# Patient Record
Sex: Male | Born: 1940
Health system: Southern US, Community
[De-identification: ages and names within clinical notes are randomized; demographics above are authoritative.]

## PROBLEM LIST (undated history)

## (undated) DIAGNOSIS — G43909 Migraine, unspecified, not intractable, without status migrainosus: Secondary | ICD-10-CM

## (undated) DIAGNOSIS — K635 Polyp of colon: Secondary | ICD-10-CM

## (undated) DIAGNOSIS — I10 Essential (primary) hypertension: Secondary | ICD-10-CM

## (undated) DIAGNOSIS — H25019 Cortical age-related cataract, unspecified eye: Secondary | ICD-10-CM

## (undated) DIAGNOSIS — G4700 Insomnia, unspecified: Secondary | ICD-10-CM

## (undated) DIAGNOSIS — F419 Anxiety disorder, unspecified: Secondary | ICD-10-CM

## (undated) DIAGNOSIS — K219 Gastro-esophageal reflux disease without esophagitis: Secondary | ICD-10-CM

## (undated) DIAGNOSIS — E119 Type 2 diabetes mellitus without complications: Secondary | ICD-10-CM

## (undated) DIAGNOSIS — E785 Hyperlipidemia, unspecified: Secondary | ICD-10-CM

## (undated) DIAGNOSIS — N189 Chronic kidney disease, unspecified: Secondary | ICD-10-CM

## (undated) DIAGNOSIS — E291 Testicular hypofunction: Secondary | ICD-10-CM

## (undated) DIAGNOSIS — N529 Male erectile dysfunction, unspecified: Secondary | ICD-10-CM

## (undated) HISTORY — PX: TONSILLECTOMY: SUR1361

## (undated) HISTORY — PX: NEPHRECTOMY: SHX65

---

## 2016-02-23 DIAGNOSIS — I456 Pre-excitation syndrome: Secondary | ICD-10-CM | POA: Insufficient documentation

## 2016-02-23 DIAGNOSIS — G4733 Obstructive sleep apnea (adult) (pediatric): Secondary | ICD-10-CM | POA: Insufficient documentation

## 2016-07-11 DIAGNOSIS — M1611 Unilateral primary osteoarthritis, right hip: Secondary | ICD-10-CM | POA: Insufficient documentation

## 2020-08-22 DIAGNOSIS — U071 COVID-19: Secondary | ICD-10-CM | POA: Diagnosis not present

## 2020-08-22 DIAGNOSIS — I10 Essential (primary) hypertension: Secondary | ICD-10-CM | POA: Diagnosis not present

## 2020-08-22 DIAGNOSIS — R55 Syncope and collapse: Secondary | ICD-10-CM | POA: Diagnosis not present

## 2020-08-22 DIAGNOSIS — Z91040 Latex allergy status: Secondary | ICD-10-CM | POA: Diagnosis not present

## 2020-08-22 DIAGNOSIS — R110 Nausea: Secondary | ICD-10-CM | POA: Insufficient documentation

## 2020-08-22 DIAGNOSIS — E119 Type 2 diabetes mellitus without complications: Secondary | ICD-10-CM | POA: Diagnosis not present

## 2020-08-22 DIAGNOSIS — R059 Cough, unspecified: Secondary | ICD-10-CM | POA: Diagnosis present

## 2020-08-23 ENCOUNTER — Emergency Department
Admission: EM | Admit: 2020-08-23 | Discharge: 2020-08-23 | Disposition: A | Discharge status: Home / Self Care | Payer: Medicare HMO | Attending: Emergency Medicine | Admitting: Emergency Medicine

## 2020-08-23 ENCOUNTER — Emergency Department: Payer: Medicare HMO

## 2020-08-23 ENCOUNTER — Encounter: Payer: Self-pay | Admitting: Emergency Medicine

## 2020-08-23 ENCOUNTER — Other Ambulatory Visit: Payer: Self-pay

## 2020-08-23 DIAGNOSIS — R55 Syncope and collapse: Secondary | ICD-10-CM

## 2020-08-23 DIAGNOSIS — E860 Dehydration: Secondary | ICD-10-CM

## 2020-08-23 DIAGNOSIS — U071 COVID-19: Secondary | ICD-10-CM

## 2020-08-23 HISTORY — DX: Type 2 diabetes mellitus without complications: E11.9

## 2020-08-23 HISTORY — DX: Essential (primary) hypertension: I10

## 2020-08-23 HISTORY — DX: Hyperlipidemia, unspecified: E78.5

## 2020-08-23 LAB — COMPREHENSIVE METABOLIC PANEL
ALT: 25 U/L (ref 0–44)
AST: 29 U/L (ref 15–41)
Albumin: 3.5 g/dL (ref 3.5–5.0)
Alkaline Phosphatase: 72 U/L (ref 38–126)
Anion gap: 11 (ref 5–15)
BUN: 23 mg/dL (ref 8–23)
CO2: 24 mmol/L (ref 22–32)
Calcium: 8.2 mg/dL — ABNORMAL LOW (ref 8.9–10.3)
Chloride: 100 mmol/L (ref 98–111)
Creatinine, Ser: 1.31 mg/dL — ABNORMAL HIGH (ref 0.61–1.24)
GFR, Estimated: 56 mL/min — ABNORMAL LOW (ref >60)
Glucose, Bld: 112 mg/dL — ABNORMAL HIGH (ref 70–99)
Potassium: 4 mmol/L (ref 3.5–5.1)
Sodium: 135 mmol/L (ref 135–145)
Total Bilirubin: 1.1 mg/dL (ref 0.3–1.2)
Total Protein: 7 g/dL (ref 6.5–8.1)

## 2020-08-23 LAB — CBC
HCT: 49.9 % (ref 39.0–52.0)
Hemoglobin: 16.4 g/dL (ref 13.0–17.0)
MCH: 27.9 pg (ref 26.0–34.0)
MCHC: 32.9 g/dL (ref 30.0–36.0)
MCV: 85 fL (ref 80.0–100.0)
Platelets: 148 10*3/uL — ABNORMAL LOW (ref 150–400)
RBC: 5.87 MIL/uL — ABNORMAL HIGH (ref 4.22–5.81)
RDW: 14.6 % (ref 11.5–15.5)
WBC: 6.6 10*3/uL (ref 4.0–10.5)
nRBC: 0 % (ref 0.0–0.2)

## 2020-08-23 LAB — RESP PANEL BY RT-PCR (FLU A&B, COVID) ARPGX2
Influenza A by PCR: NEGATIVE
Influenza B by PCR: NEGATIVE
SARS Coronavirus 2 by RT PCR: POSITIVE — AB

## 2020-08-23 LAB — TROPONIN I (HIGH SENSITIVITY): Troponin I (High Sensitivity): 13 ng/L (ref <18)

## 2020-08-23 MED ORDER — SODIUM CHLORIDE 0.9 % IV BOLUS
1000.0000 mL | Freq: Once | INTRAVENOUS | Status: AC
  Administered 2020-08-23: 1000 mL via INTRAVENOUS

## 2020-08-23 NOTE — ED Triage Notes (Signed)
Pt in with co syncopal episode at home hx of diarrhea x 6 days. Has recently been exposed to covid but has not been tested. Normal HR is around 50, 46bpm presently in triage. Denies any injury from fall, does have a cough.

## 2020-08-23 NOTE — ED Notes (Signed)
Pt sts he is feeling better after IV bolus. Easily arousable from sleep.

## 2020-08-23 NOTE — ED Triage Notes (Signed)
EMS brings pt in from home for c/o cough; here with wife with covid-like symptoms; near-syncope PTA

## 2020-08-23 NOTE — ED Notes (Addendum)
Pt ambulated 100 ft with RN escorting. Strong steady gait without assistance. O2 on RA remained 94-96% while ambulating.  Denies dizziness.

## 2020-08-23 NOTE — ED Notes (Signed)
Pt c/o syncope x 3 today. Notes mild headache. Denies migraines. No focal weakness. Speech is clear. Pt denies chest pain or SOB.  Reports intermittent nausea x 1 week with some abdominal cramping. Reports 2 episodes of watery diarrhea today.

## 2020-08-23 NOTE — Discharge Instructions (Addendum)
Please seek medical attention for any high fevers, chest pain, shortness of breath, change in behavior, persistent vomiting, bloody stool or any other new or concerning symptoms.  

## 2020-08-23 NOTE — ED Provider Notes (Signed)
[facility] Emergency Department Provider Note   ____________________________________________   I have reviewed the triage vital signs and the nursing notes.   HISTORY  Chief Complaint Syncopal episode History limited by: Not Limited   HPI Gregory Campbell is a 80 y.o. male who presents to the emergency department today with primary concern for a syncopal episode.  Patient states he was actually try to talk to his wife who just passed out.  He says that he has felt sick for roughly the past 10 days.  He has had nausea, decreased appetite and cough.  His wife has had similar symptoms and he did have a COVID exposure.  Patient denies any significant shortness of breath or chest pain.  Denies any fevers.  He has been feeling weak over the past 10 days but today was first in the past that.   Records reviewed. Per medical record review patient has a history of DM, HLD, HTN.   Past Medical History:  Diagnosis Date  . Diabetes mellitus without complication ([place])   . Hyperlipidemia   . Hypertension     There are no problems to display for this patient.     Prior to Admission medications   Not on File    Allergies Codeine and Latex  No family history on file.  Social History    Review of Systems Constitutional: No fever/chills Eyes: No visual changes. ENT: No sore throat. Cardiovascular: Denies chest pain. Respiratory: Denies shortness of breath. Positive for cough. Gastrointestinal: No abdominal pain.  Positive for nausea, diarrhea and decreased appetite.  Genitourinary: Negative for dysuria. Musculoskeletal: Negative for back pain. Skin: Negative for rash. Neurological: Positive for syncopal episode. ____________________________________________   PHYSICAL EXAM:  VITAL SIGNS: ED Triage Vitals  Enc Vitals Group     BP 08/23/20 0011 (!) 106/49     Pulse Rate 08/23/20 0011 (!) 45     Resp 08/23/20 0011 20     Temp 08/23/20 0011 98.3 F  (36.8 C)     Temp Source 08/23/20 0011 Oral     SpO2 08/23/20 0005 93 %     Weight 08/23/20 0012 226 lb (102.5 kg)     Height 08/23/20 0012 5\' 11"  (1.803 m)     Head Circumference --      Peak Flow --      Pain Score 08/23/20 0012 5   Constitutional: Alert and oriented.  Eyes: Conjunctivae are normal.  ENT      Head: Normocephalic and atraumatic.      Nose: No congestion/rhinnorhea.      Mouth/Throat: Mucous membranes are moist.      Neck: No stridor. Hematological/Lymphatic/Immunilogical: No cervical lymphadenopathy. Cardiovascular: Normal rate, regular rhythm.  No murmurs, rubs, or gallops.  Respiratory: Normal respiratory effort without tachypnea nor retractions. Breath sounds are clear and equal bilaterally. No wheezes/rales/rhonchi. Gastrointestinal: Soft and non tender. No rebound. No guarding.  Genitourinary: Deferred Musculoskeletal: Normal range of motion in all extremities. No lower extremity edema. Neurologic:  Normal speech and language. No gross focal neurologic deficits are appreciated.  Skin:  Skin is warm, dry and intact. No rash noted. Psychiatric: Mood and affect are normal. Speech and behavior are normal. Patient exhibits appropriate insight and judgment.  ____________________________________________    LABS (pertinent positives/negatives)  CBC wbc 6.6, hgb 16.4, plt 148 COVID positive Trop hs 13 CMP na 135, k 4.0, cl 100, glu 112, cr 1.31 ____________________________________________   EKG  I, Nance Pear, attending physician, personally viewed and  interpreted this EKG  EKG Time: 0016 Rate: 45 Rhythm: sinus bradycardia Axis: left axis deviation Intervals: qtc 420 QRS: narrow ST changes: no st elevation Impression: abnormal ekg  ____________________________________________    RADIOLOGY  CXR Hazy bilateral  opacities  ____________________________________________   PROCEDURES  Procedures  ____________________________________________   INITIAL IMPRESSION / ASSESSMENT AND PLAN / ED COURSE  Pertinent labs & imaging results that were available during my care of the patient were reviewed by me and considered in my medical decision making (see chart for details).   Patient presented to the emergency department today after a syncopal episode.  Patient did have symptoms concerning for possible COVID infection and his COVID was positive.  Patient was not hypoxic here.  Did have concerns for some dehydration and after liter of fluid patient was no longer orthostatic.  This time do think it is reasonable for patient to be discharged home.  Will send patient's information to ambulatory referral for COVID treatment.    ____________________________________________   FINAL CLINICAL IMPRESSION(S) / ED DIAGNOSES  Final diagnoses:  COVID-19  Dehydration     Note: This dictation was prepared with Dragon dictation. Any transcriptional errors that result from this process are unintentional     Nance Pear, MD 08/23/20 [phone]

## 2020-08-24 ENCOUNTER — Telehealth: Payer: Self-pay | Admitting: *Deleted

## 2020-08-24 NOTE — Telephone Encounter (Signed)
Called to discuss with patient about COVID-19 symptoms and the use of one of the available treatments for those with mild to moderate Covid symptoms and at a high risk of hospitalization.  Pt appears to qualify for outpatient treatment due to co-morbid conditions and/or a member of an at-risk group in accordance with the FDA Emergency Use Authorization.    Symptom onset:  Vaccinated:  Booster?  Immunocompromised?  Qualifiers:   Unable to reach pt - Left VM to return call for information  Tesean Stump, Gillis Ends

## 2020-12-18 ENCOUNTER — Other Ambulatory Visit (HOSPITAL_COMMUNITY): Payer: Self-pay | Admitting: Internal Medicine

## 2020-12-18 ENCOUNTER — Other Ambulatory Visit: Payer: Self-pay | Admitting: Internal Medicine

## 2020-12-18 DIAGNOSIS — R109 Unspecified abdominal pain: Secondary | ICD-10-CM

## 2020-12-20 ENCOUNTER — Ambulatory Visit: Payer: Medicare HMO

## 2021-01-02 ENCOUNTER — Ambulatory Visit
Admission: RE | Admit: 2021-01-02 | Discharge: 2021-01-02 | Disposition: A | Discharge status: Home / Self Care | Payer: Medicare HMO | Source: Ambulatory Visit | Attending: Internal Medicine | Admitting: Internal Medicine

## 2021-01-02 ENCOUNTER — Other Ambulatory Visit: Payer: Self-pay

## 2021-01-02 DIAGNOSIS — R109 Unspecified abdominal pain: Secondary | ICD-10-CM | POA: Insufficient documentation

## 2021-01-02 IMAGING — CT CT ABD-PELV W/O CM
2 of 4 series · 16 of 46 positions shown, 18 images · non-contrast
Comparison: None available

CLINICAL DATA: Umbilical pain and bulge, right. History of
nephrectomy for renal cell carcinoma [T4].

EXAM:
CT ABDOMEN AND PELVIS WITHOUT CONTRAST
TECHNIQUE: Multidetector CT imaging of the abdomen and pelvis was performed
following the standard protocol without IV contrast.

[Series 2: axial st · axial · 0.87mm/px · z∈[-361,+149]mm · 13 of 112 slices shown, 15 images]
[im 5/112  soft-tissue]
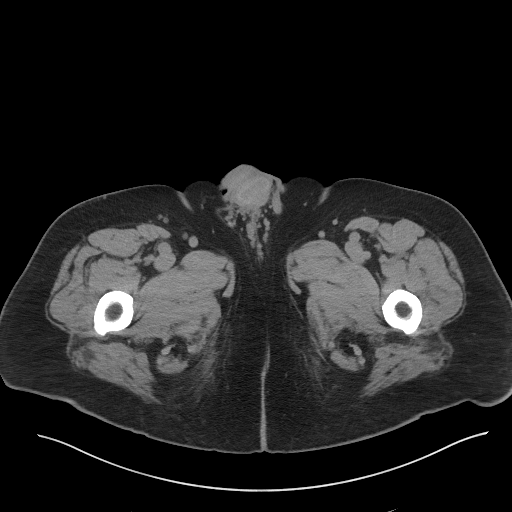
[im 5/112  bone]
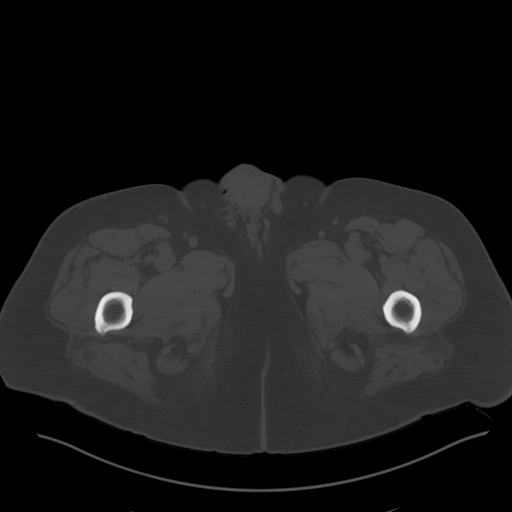
[im 15/112  soft-tissue]
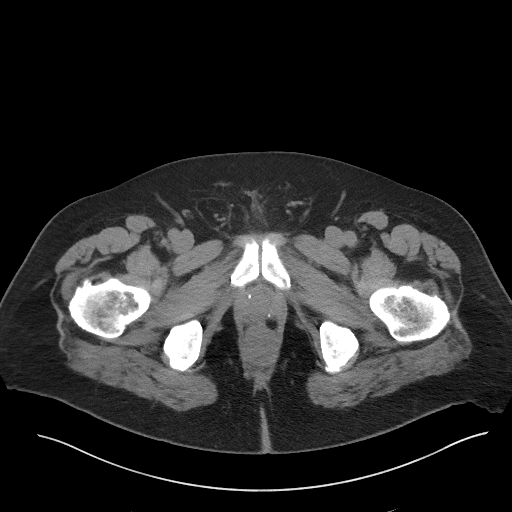
[im 25/112  soft-tissue]
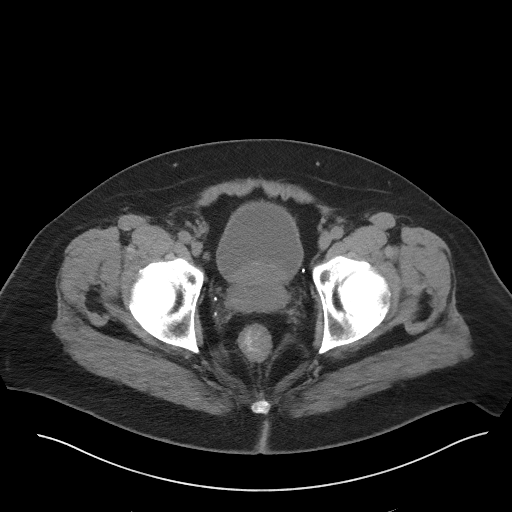
[im 29/112  soft-tissue]
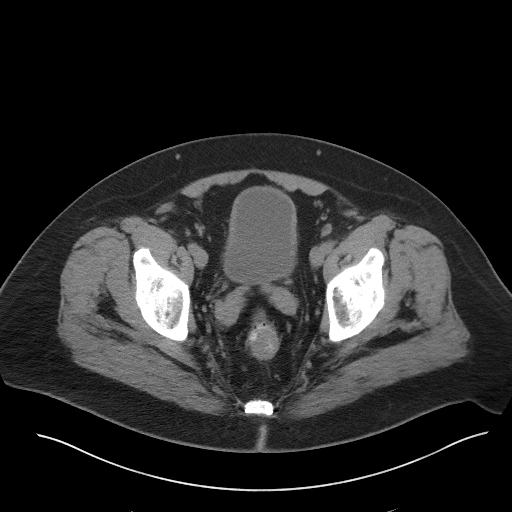
[im 39/112  soft-tissue]
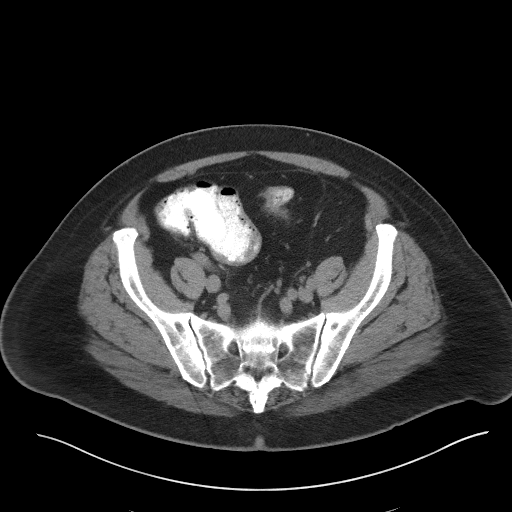
[im 49/112  soft-tissue]
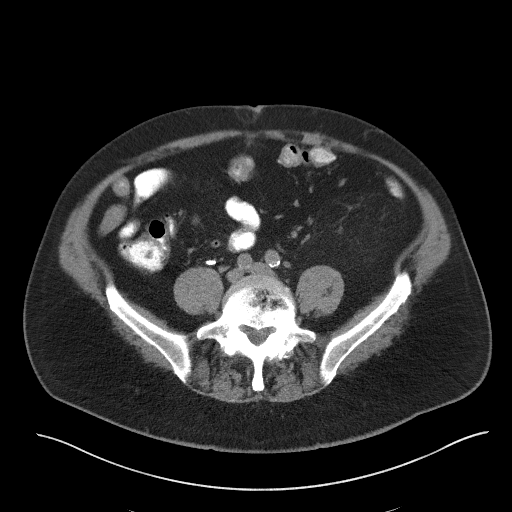
[im 58/112  soft-tissue]
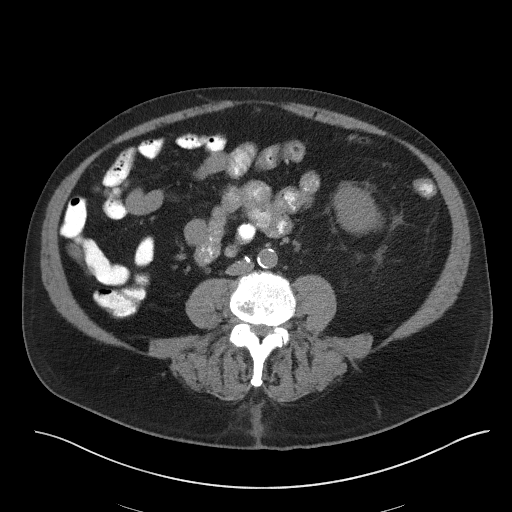
[im 63/112  soft-tissue]
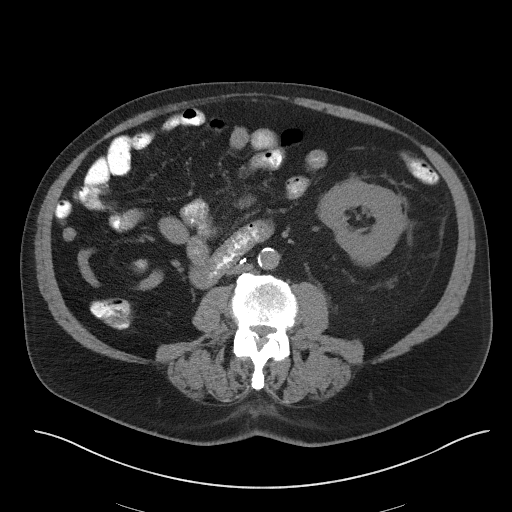
[im 73/112  soft-tissue]
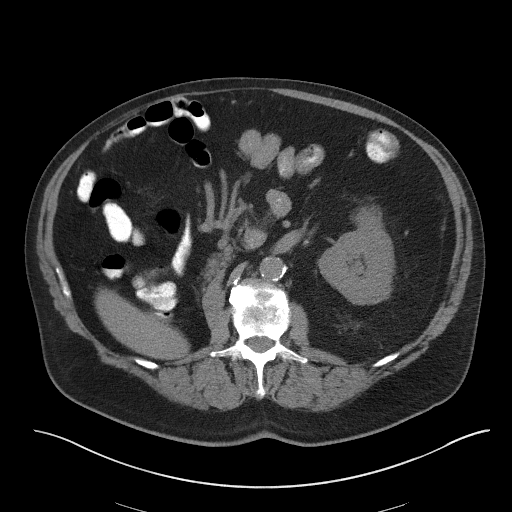
[im 73/112  bone]
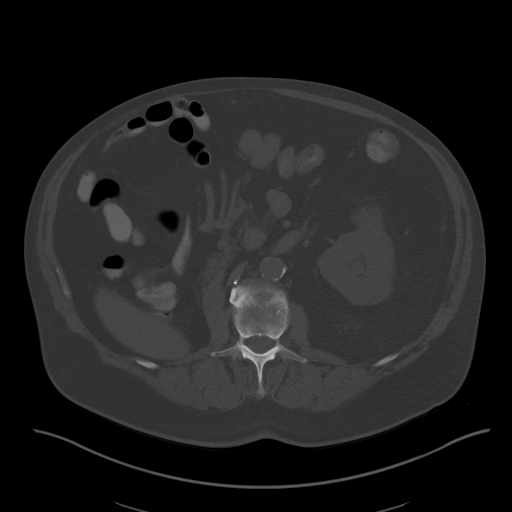
[im 83/112  soft-tissue]
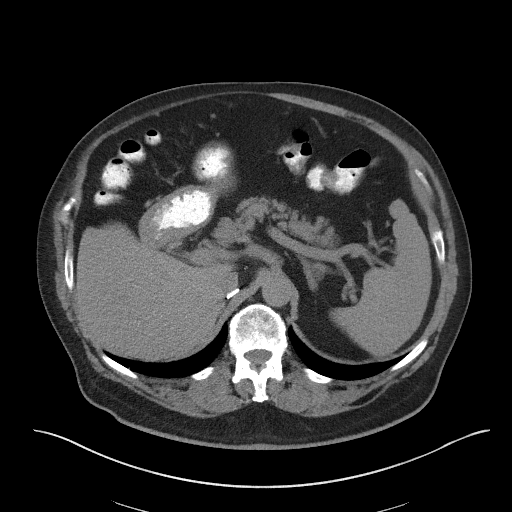
[im 87/112  soft-tissue]
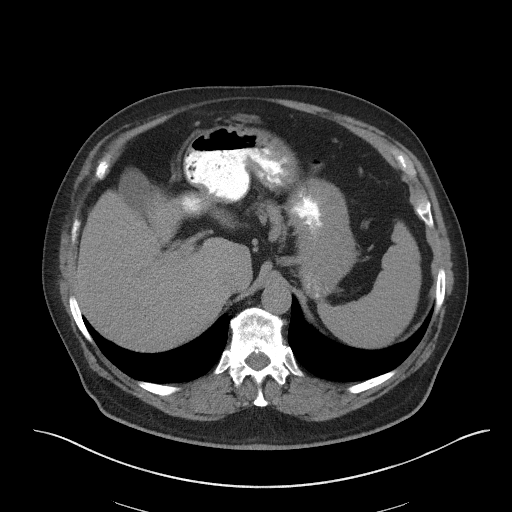
[im 97/112  soft-tissue]
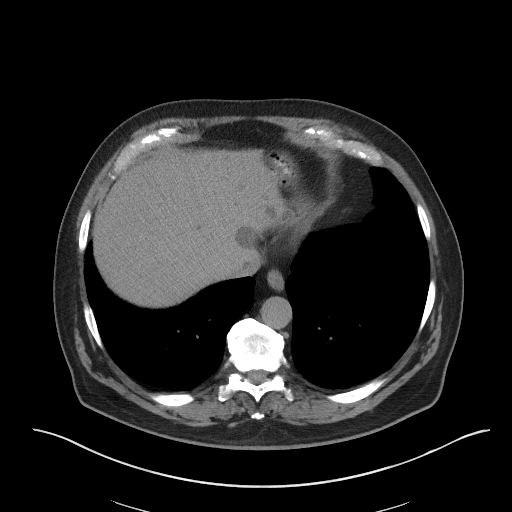
[im 107/112  soft-tissue]
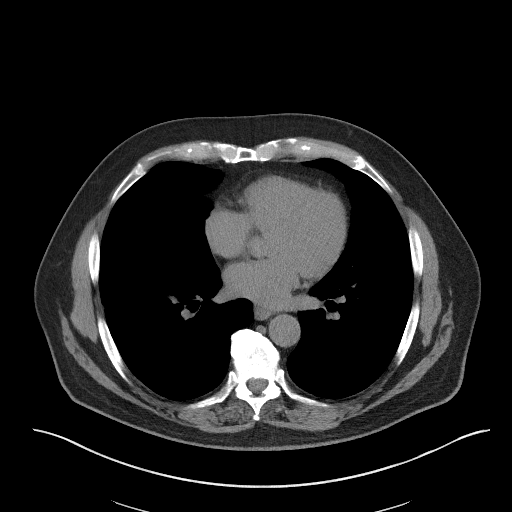

[Series 5: coronal st · coronal · 0.94mm/px · 3 of 110 slices shown]
[im 37/110  soft-tissue]
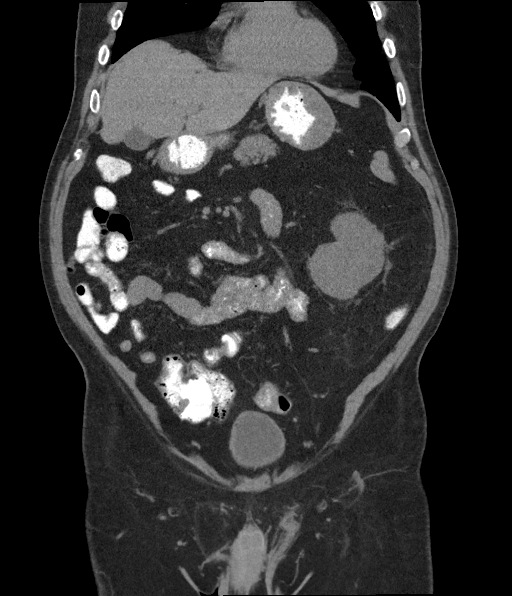
[im 49/110  soft-tissue]
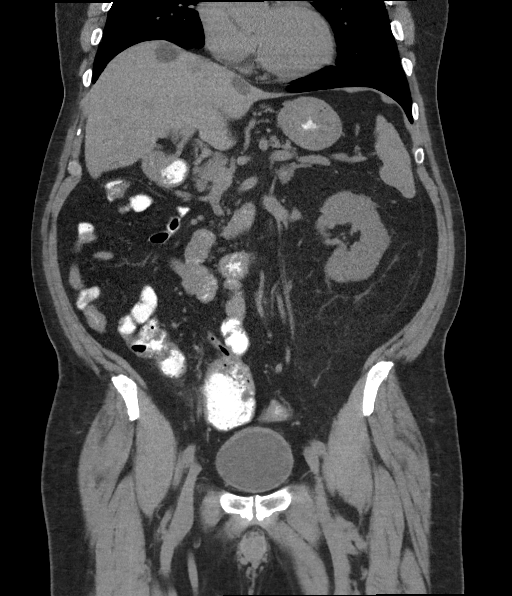
[im 61/110  soft-tissue]
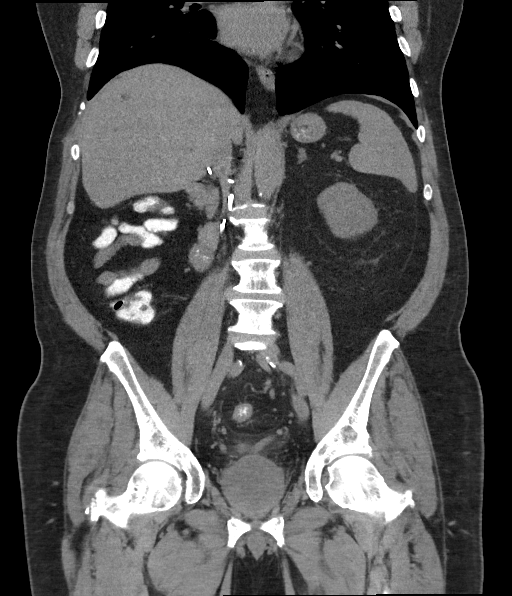

[16 of 46 positions shown; findings below may reference images not displayed]

FINDINGS: Lower chest: No pleural or pericardial effusion.

Hepatobiliary: Scattered low-attenuation liver lesions largest
cm in segment 7 measures fluid attenuation presumably cyst.
Immediately medial is a 3 cm lesion which measures above simple
fluid, nonspecific. Others are smaller and incompletely
characterized. Gallbladder is nondistended. No biliary ductal
dilatation.

Pancreas: Unremarkable. No pancreatic ductal dilatation or
surrounding inflammatory changes.

Spleen: Normal in size without focal abnormality.

Adrenals/Urinary Tract: Previous right nephrectomy. Left adrenal
gland and kidney unremarkable. Urinary bladder incompletely
distended.

Stomach/Bowel: Stomach and small bowel are nondistended. Normal
appendix. The colon is nondilated, unremarkable.

Vascular/Lymphatic: Scattered aortoiliac calcified plaque without
aneurysm. No abdominal or pelvic adenopathy.

Reproductive: Prostate enlargement.

Other: Left pelvic phlebolith.  No ascites.  No free air.

Musculoskeletal: Small right paraumbilical hernia containing only
mesenteric fat. Degenerative disc disease L4-5, L5-S1. No fracture
or worrisome bone lesion.
IMPRESSION: 1. Small right paraumbilical hernia containing only  mesenteric fat.
2. Multiple liver lesions, the largest probable cyst but the
remainder nonspecific. In the absence of prior studies to
demonstrate stability, liver protocol MR with contrast suggested for
further characterization.

## 2021-01-15 ENCOUNTER — Other Ambulatory Visit (HOSPITAL_COMMUNITY): Payer: Self-pay | Admitting: Internal Medicine

## 2021-01-15 ENCOUNTER — Other Ambulatory Visit: Payer: Self-pay | Admitting: Internal Medicine

## 2021-01-15 DIAGNOSIS — R935 Abnormal findings on diagnostic imaging of other abdominal regions, including retroperitoneum: Secondary | ICD-10-CM

## 2021-01-15 DIAGNOSIS — R109 Unspecified abdominal pain: Secondary | ICD-10-CM

## 2021-01-25 ENCOUNTER — Ambulatory Visit
Admission: RE | Admit: 2021-01-25 | Discharge: 2021-01-25 | Disposition: A | Discharge status: Home / Self Care | Payer: Medicare HMO | Source: Ambulatory Visit | Attending: Internal Medicine | Admitting: Internal Medicine

## 2021-01-25 ENCOUNTER — Other Ambulatory Visit: Payer: Self-pay

## 2021-01-25 DIAGNOSIS — R109 Unspecified abdominal pain: Secondary | ICD-10-CM | POA: Insufficient documentation

## 2021-01-25 DIAGNOSIS — R935 Abnormal findings on diagnostic imaging of other abdominal regions, including retroperitoneum: Secondary | ICD-10-CM | POA: Diagnosis present

## 2021-01-25 MED ORDER — GADOBUTROL 1 MMOL/ML IV SOLN
10.0000 mL | Freq: Once | INTRAVENOUS | Status: AC | PRN
  Administered 2021-01-25: 10 mL via INTRAVENOUS

## 2021-01-31 ENCOUNTER — Other Ambulatory Visit: Payer: Self-pay | Admitting: Internal Medicine

## 2021-01-31 DIAGNOSIS — K7689 Other specified diseases of liver: Secondary | ICD-10-CM

## 2021-02-02 DIAGNOSIS — Z9841 Cataract extraction status, right eye: Secondary | ICD-10-CM | POA: Diagnosis not present

## 2021-02-02 DIAGNOSIS — Z9842 Cataract extraction status, left eye: Secondary | ICD-10-CM | POA: Diagnosis not present

## 2021-02-02 DIAGNOSIS — H40023 Open angle with borderline findings, high risk, bilateral: Secondary | ICD-10-CM | POA: Diagnosis not present

## 2021-02-02 DIAGNOSIS — H52223 Regular astigmatism, bilateral: Secondary | ICD-10-CM | POA: Diagnosis not present

## 2021-02-02 DIAGNOSIS — E119 Type 2 diabetes mellitus without complications: Secondary | ICD-10-CM | POA: Diagnosis not present

## 2021-02-26 DIAGNOSIS — E1122 Type 2 diabetes mellitus with diabetic chronic kidney disease: Secondary | ICD-10-CM | POA: Diagnosis not present

## 2021-02-28 DIAGNOSIS — G8929 Other chronic pain: Secondary | ICD-10-CM | POA: Diagnosis not present

## 2021-02-28 DIAGNOSIS — N1831 Chronic kidney disease, stage 3a: Secondary | ICD-10-CM | POA: Diagnosis not present

## 2021-02-28 DIAGNOSIS — M5136 Other intervertebral disc degeneration, lumbar region: Secondary | ICD-10-CM | POA: Diagnosis not present

## 2021-02-28 DIAGNOSIS — Z794 Long term (current) use of insulin: Secondary | ICD-10-CM | POA: Diagnosis not present

## 2021-02-28 DIAGNOSIS — M545 Low back pain, unspecified: Secondary | ICD-10-CM | POA: Diagnosis not present

## 2021-02-28 DIAGNOSIS — E1122 Type 2 diabetes mellitus with diabetic chronic kidney disease: Secondary | ICD-10-CM | POA: Diagnosis not present

## 2021-02-28 DIAGNOSIS — M5441 Lumbago with sciatica, right side: Secondary | ICD-10-CM | POA: Diagnosis not present

## 2021-02-28 DIAGNOSIS — M5431 Sciatica, right side: Secondary | ICD-10-CM | POA: Diagnosis not present

## 2021-02-28 DIAGNOSIS — E669 Obesity, unspecified: Secondary | ICD-10-CM | POA: Diagnosis not present

## 2021-03-14 ENCOUNTER — Other Ambulatory Visit: Payer: Self-pay | Admitting: Orthopedic Surgery

## 2021-03-14 DIAGNOSIS — G8929 Other chronic pain: Secondary | ICD-10-CM

## 2021-03-14 DIAGNOSIS — M5136 Other intervertebral disc degeneration, lumbar region: Secondary | ICD-10-CM

## 2021-03-14 DIAGNOSIS — M5441 Lumbago with sciatica, right side: Secondary | ICD-10-CM | POA: Diagnosis not present

## 2021-03-14 DIAGNOSIS — M4807 Spinal stenosis, lumbosacral region: Secondary | ICD-10-CM | POA: Diagnosis not present

## 2021-03-14 DIAGNOSIS — M5431 Sciatica, right side: Secondary | ICD-10-CM | POA: Diagnosis not present

## 2021-03-21 DIAGNOSIS — E785 Hyperlipidemia, unspecified: Secondary | ICD-10-CM | POA: Diagnosis not present

## 2021-03-21 DIAGNOSIS — E1142 Type 2 diabetes mellitus with diabetic polyneuropathy: Secondary | ICD-10-CM | POA: Diagnosis not present

## 2021-03-21 DIAGNOSIS — Z794 Long term (current) use of insulin: Secondary | ICD-10-CM | POA: Diagnosis not present

## 2021-03-21 DIAGNOSIS — E1169 Type 2 diabetes mellitus with other specified complication: Secondary | ICD-10-CM | POA: Diagnosis not present

## 2021-03-24 ENCOUNTER — Other Ambulatory Visit: Payer: Self-pay

## 2021-03-24 ENCOUNTER — Encounter: Payer: Self-pay | Admitting: Emergency Medicine

## 2021-03-24 DIAGNOSIS — K5903 Drug induced constipation: Secondary | ICD-10-CM | POA: Insufficient documentation

## 2021-03-24 DIAGNOSIS — E119 Type 2 diabetes mellitus without complications: Secondary | ICD-10-CM | POA: Diagnosis not present

## 2021-03-24 DIAGNOSIS — Z91040 Latex allergy status: Secondary | ICD-10-CM | POA: Insufficient documentation

## 2021-03-24 DIAGNOSIS — R339 Retention of urine, unspecified: Secondary | ICD-10-CM | POA: Diagnosis not present

## 2021-03-24 DIAGNOSIS — R109 Unspecified abdominal pain: Secondary | ICD-10-CM | POA: Diagnosis not present

## 2021-03-24 DIAGNOSIS — K5900 Constipation, unspecified: Secondary | ICD-10-CM | POA: Diagnosis not present

## 2021-03-24 DIAGNOSIS — I10 Essential (primary) hypertension: Secondary | ICD-10-CM | POA: Diagnosis not present

## 2021-03-24 LAB — URINALYSIS, COMPLETE (UACMP) WITH MICROSCOPIC
Bilirubin Urine: NEGATIVE
Glucose, UA: 100 mg/dL — AB
Hgb urine dipstick: NEGATIVE
Ketones, ur: NEGATIVE mg/dL
Leukocytes,Ua: NEGATIVE
Nitrite: NEGATIVE
Protein, ur: 30 mg/dL — AB
Specific Gravity, Urine: 1.015 (ref 1.005–1.030)
Squamous Epithelial / HPF: NONE SEEN (ref 0–5)
pH: 5 (ref 5.0–8.0)

## 2021-03-24 LAB — CBC
HCT: 49.7 % (ref 39.0–52.0)
Hemoglobin: 16.5 g/dL (ref 13.0–17.0)
MCH: 28.7 pg (ref 26.0–34.0)
MCHC: 33.2 g/dL (ref 30.0–36.0)
MCV: 86.6 fL (ref 80.0–100.0)
Platelets: 166 10*3/uL (ref 150–400)
RBC: 5.74 MIL/uL (ref 4.22–5.81)
RDW: 14.3 % (ref 11.5–15.5)
WBC: 8 10*3/uL (ref 4.0–10.5)
nRBC: 0 % (ref 0.0–0.2)

## 2021-03-24 LAB — BASIC METABOLIC PANEL
Anion gap: 9 (ref 5–15)
BUN: 32 mg/dL — ABNORMAL HIGH (ref 8–23)
CO2: 23 mmol/L (ref 22–32)
Calcium: 9.3 mg/dL (ref 8.9–10.3)
Chloride: 102 mmol/L (ref 98–111)
Creatinine, Ser: 1.34 mg/dL — ABNORMAL HIGH (ref 0.61–1.24)
GFR, Estimated: 54 mL/min — ABNORMAL LOW (ref >60)
Glucose, Bld: 296 mg/dL — ABNORMAL HIGH (ref 70–99)
Potassium: 5.2 mmol/L — ABNORMAL HIGH (ref 3.5–5.1)
Sodium: 134 mmol/L — ABNORMAL LOW (ref 135–145)

## 2021-03-24 NOTE — ED Triage Notes (Signed)
Pt up to desk repeatedly yelling out, "Please help, please have mercy, please help me". This RN placed patient back in a wheelchair, explained will pull patient for triage as soon as possible.

## 2021-03-24 NOTE — ED Notes (Signed)
Pt called this RN over to request to be moved closer, pt states he does not want to watch the fights that are on the TV.

## 2021-03-24 NOTE — ED Triage Notes (Signed)
First RN Note: Pt to ED via POV with c/o constipation x 2 days, states has not urinated x 2 days.

## 2021-03-25 ENCOUNTER — Emergency Department: Payer: Medicare HMO

## 2021-03-25 ENCOUNTER — Emergency Department
Admission: EM | Admit: 2021-03-25 | Discharge: 2021-03-25 | Disposition: A | Discharge status: Home / Self Care | Payer: Medicare HMO | Attending: Emergency Medicine | Admitting: Emergency Medicine

## 2021-03-25 DIAGNOSIS — R109 Unspecified abdominal pain: Secondary | ICD-10-CM | POA: Diagnosis not present

## 2021-03-25 DIAGNOSIS — K5903 Drug induced constipation: Secondary | ICD-10-CM

## 2021-03-25 DIAGNOSIS — R339 Retention of urine, unspecified: Secondary | ICD-10-CM

## 2021-03-25 LAB — CBG MONITORING, ED: Glucose-Capillary: 160 mg/dL — ABNORMAL HIGH (ref 70–99)

## 2021-03-25 MED ORDER — BISACODYL 10 MG RE SUPP: 10.0000 mg | Freq: Every day | RECTAL | 0 refills | Status: DC | PRN

## 2021-03-25 MED ORDER — ONDANSETRON 4 MG PO TBDP
4.0000 mg | ORAL_TABLET | Freq: Once | ORAL | Status: AC
  Administered 2021-03-25: 4 mg via ORAL
  Filled 2021-03-25: qty 1

## 2021-03-25 MED ORDER — BISACODYL 10 MG RE SUPP
10.0000 mg | Freq: Once | RECTAL | Status: AC
  Administered 2021-03-25: 10 mg via RECTAL
  Filled 2021-03-25: qty 1

## 2021-03-25 MED ORDER — BISACODYL 10 MG RE SUPP: 10.0000 mg | Freq: Every day | RECTAL | 0 refills | Status: AC | PRN

## 2021-03-25 MED ORDER — SORBITOL 70 % SOLN
960.0000 mL | TOPICAL_OIL | Freq: Once | ORAL | Status: AC
  Administered 2021-03-25: 960 mL via RECTAL
  Filled 2021-03-25: qty 473

## 2021-03-25 MED ORDER — LACTULOSE 10 GM/15ML PO SOLN: 20.0000 g | Freq: Three times a day (TID) | ORAL | 0 refills | Status: DC | PRN

## 2021-03-25 MED ORDER — LACTULOSE 10 GM/15ML PO SOLN
20.0000 g | Freq: Once | ORAL | Status: AC
  Administered 2021-03-25: 20 g via ORAL
  Filled 2021-03-25: qty 30

## 2021-03-25 MED ORDER — SODIUM CHLORIDE 0.9 % IV SOLN: INTRAVENOUS | Status: DC

## 2021-03-25 MED ORDER — IOHEXOL 350 MG/ML SOLN
75.0000 mL | Freq: Once | INTRAVENOUS | Status: AC | PRN
  Administered 2021-03-25: 75 mL via INTRAVENOUS

## 2021-03-25 NOTE — ED Provider Notes (Signed)
Patient care assumed at 7 AM.  Briefly, 80 year old male here with acute urinary retention and constipation.  Suspect drug-induced constipation with secondary urinary retention.  CT scan pending.  Labs Reviewed  URINALYSIS, COMPLETE (UACMP) WITH MICROSCOPIC - Abnormal; Notable for the following components:      Result Value   Glucose, UA 100 (*)    Protein, ur 30 (*)    Bacteria, UA RARE (*)    All other components within normal limits  BASIC METABOLIC PANEL - Abnormal; Notable for the following components:   Sodium 134 (*)    Potassium 5.2 (*)    Glucose, Bld 296 (*)    BUN 32 (*)    Creatinine, Ser 1.34 (*)    GFR, Estimated 54 (*)    All other components within normal limits  CBC    Course of Care: CT scan reviewed, shows significant stool burden but otherwise no acute intra-abdominal pathology.  He has had an enema with significant bowel movement here.  Will start on a high dose regimen in addition to suppositories, discharged with outpatient follow-up.  No apparent emergent medical pathologies.     Duffy Bruce, MD 03/25/21 1026

## 2021-03-25 NOTE — ED Notes (Signed)
Leg bag placed on pt, with teaching to both pt and wife. All questions answered.

## 2021-03-25 NOTE — Discharge Instructions (Addendum)
I recommend that you increase your water and fiber intake. If you are not able to eat foods high in fiber, you may use Benefiber or Metamucil over-the-counter. I also recommend  that after your current issue resolves, you use MiraLAX 1-2 times a day and Colace 100 mg twice a day to help with bowel movements. These medications are over the counter.  You may use other over-the-counter medications such as Dulcolax, Fleet enemas, magnesium citrate as needed for constipation. Please note that some of these medications may cause you to have abdominal cramping which is normal. If you develop severe abdominal pain, fever (temperature of 100.4 or higher), persistent vomiting, distention of your abdomen, unable to have a bowel movement for 5 days or are not passing gas, please return to the hospital.  CALL UROLOGY (DR. Erlene Quan) for Foley catheter follow-up and removal in 3-4 days

## 2021-03-25 NOTE — ED Provider Notes (Signed)
[facility] Emergency Department Provider Note  ____________________________________________   Event Date/Time   First MD Initiated Contact with Patient 03/25/21 [phone]     (approximate)  I have reviewed the triage vital signs and the nursing notes.   HISTORY  Chief Complaint Urinary Retention    HPI Gregory Campbell is a 80 y.o. male with history of hypertension, diabetes, hyperlipidemia who presents to the emergency department complaints of constipation and urinary retention.  States he has not had a normal bowel movement in 2 days because he is taking hydrocodone for chronic hip pain.  He was trying to give himself an enema at home without success.  States he is passing gas.  No nausea, vomiting.  No fever.  Also complaining of urinary retention without dysuria, hematuria or discharge.  He has never had urinary retention before.  Foley catheter placed in triage and he reports he is feeling better but asking for something for his constipation.        Past Medical History:  Diagnosis Date   Diabetes mellitus without complication ([place])    Hyperlipidemia    Hypertension     There are no problems to display for this patient.   History reviewed. No pertinent surgical history.  Prior to Admission medications   Not on File    Allergies Codeine and Latex  History reviewed. No pertinent family history.  Social History    Review of Systems Constitutional: No fever. Eyes: No visual changes. ENT: No sore throat. Cardiovascular: Denies chest pain. Respiratory: Denies shortness of breath. Gastrointestinal: No nausea, vomiting, diarrhea. Genitourinary: Negative for dysuria. Musculoskeletal: Negative for back pain. Skin: Negative for rash. Neurological: Negative for focal weakness or numbness.  ____________________________________________   PHYSICAL EXAM:  VITAL SIGNS: ED Triage Vitals  Enc Vitals Group     BP 03/24/21 2110 (!) 166/85      Pulse Rate 03/24/21 2110 81     Resp 03/24/21 2110 (!) 24     Temp 03/24/21 2110 98.4 F (36.9 C)     Temp Source 03/24/21 2110 Oral     SpO2 03/24/21 2110 96 %     Weight 03/24/21 2108 225 lb 15.5 oz (102.5 kg)     Height 03/24/21 2108 5' 10.75" (1.797 m)     Head Circumference --      Peak Flow --      Pain Score 03/24/21 2108 8     Pain Loc --      Pain Edu? --      Excl. in [place]? --    CONSTITUTIONAL: Alert and oriented and responds appropriately to questions. Well-appearing; well-nourished HEAD: Normocephalic EYES: Conjunctivae clear, pupils appear equal, EOM appear intact ENT: normal nose; moist mucous membranes NECK: Supple, normal ROM CARD: RRR; S1 and S2 appreciated; no murmurs, no clicks, no rubs, no gallops RESP: Normal chest excursion without splinting or tachypnea; breath sounds clear and equal bilaterally; no wheezes, no rhonchi, no rales, no hypoxia or respiratory distress, speaking full sentences ABD/GI: Normal bowel sounds; non-distended; soft, non-tender, no rebound, no guarding, no peritoneal signs, no hepatosplenomegaly BACK: The back appears normal EXT: Normal ROM in all joints; no deformity noted, no edema; no cyanosis SKIN: Normal color for age and race; warm; no rash on exposed skin NEURO: Moves all extremities equally PSYCH: The patient's mood and manner are appropriate.  ____________________________________________   LABS (all labs ordered are listed, but only abnormal results are displayed)  Labs Reviewed  URINALYSIS, COMPLETE (UACMP) WITH  MICROSCOPIC - Abnormal; Notable for the following components:      Result Value   Glucose, UA 100 (*)    Protein, ur 30 (*)    Bacteria, UA RARE (*)    All other components within normal limits  BASIC METABOLIC PANEL - Abnormal; Notable for the following components:   Sodium 134 (*)    Potassium 5.2 (*)    Glucose, Bld 296 (*)    BUN 32 (*)    Creatinine, Ser 1.34 (*)    GFR, Estimated 54 (*)    All other  components within normal limits  CBC   ____________________________________________  EKG   ____________________________________________  RADIOLOGY I, Avarae Zwart, personally viewed and evaluated these images (plain radiographs) as part of my medical decision making, as well as reviewing the written report by the radiologist.  ED MD interpretation:    Official radiology report(s): No results found.  ____________________________________________   PROCEDURES  Procedure(s) performed (including Critical Care):  Procedures   ____________________________________________   INITIAL IMPRESSION / ASSESSMENT AND PLAN / ED COURSE  As part of my medical decision making, I reviewed the following data within the [place] notes reviewed and incorporated, Labs reviewed , Old chart reviewed, and Notes from prior ED visits         Patient here with urinary retention.  May be from constipation.  He had about a liter on bladder scan in triage and a Foley catheter was placed and he reports feeling much better.  Urine does not appear infected today.  His labs show chronic kidney disease which appears stable.  Very minimally elevated potassium level.  He complains of constipation today.  His abdominal exam is benign.  States he is passing gas.  No fevers or vomiting.  Low suspicion for bowel obstruction, appendicitis, colitis, diverticulitis, perforation.  Will give soapsuds enema and reassess.  ED PROGRESS  Patient who was able to have a bowel movement but now states he is feeling worse with nausea and increasing pain.  Given Zofran without much relief.  Will obtain CT of the abdomen pelvis for further evaluation.  Signed out to oncoming ED physician.  I reviewed all nursing notes and pertinent previous records as available.  I have reviewed and interpreted any EKGs, lab and urine results, imaging (as  available).  ____________________________________________   FINAL CLINICAL IMPRESSION(S) / ED DIAGNOSES  Final diagnoses:  Urinary retention  Drug-induced constipation     ED Discharge Orders     None       *Please note:  [name] was evaluated in Emergency Department on 03/25/2021 for the symptoms described in the history of present illness. He was evaluated in the context of the global COVID-19 pandemic, which necessitated consideration that the patient might be at risk for infection with the SARS-CoV-2 virus that causes COVID-19. Institutional protocols and algorithms that pertain to the evaluation of patients at risk for COVID-19 are in a state of rapid change based on information released by regulatory bodies including the CDC and federal and state organizations. These policies and algorithms were followed during the patient's care in the ED.  Some ED evaluations and interventions may be delayed as a result of limited staffing during and the pandemic.*   Note:  This document was prepared using Dragon voice recognition software and may include unintentional dictation errors.    Arietta Eisenstein, Delice Bison, DO 03/25/21 [phone]

## 2021-03-25 NOTE — ED Notes (Signed)
Pt requesting to speak with Dr Ellender Hose prior to discharge.

## 2021-03-25 NOTE — ED Notes (Signed)
Pt states that if he goes to the bathroom, he may still want to leave. Pt kept unhooked from monitor for this reason. Leg bag kept on.

## 2021-03-25 NOTE — ED Notes (Signed)
Successful enema. Large bm noted

## 2021-04-03 NOTE — Progress Notes (Signed)
04/04/21 9:44 AM   Gregory Campbell [birth year] [id number]  Referring provider:  Kirk Ruths, MD [place] [facility] [place],  [place] [address] Chief Complaint  Patient presents with   Urinary Retention    HPI: Gregory Campbell is a 80 y.o.male who presents today for further evaluation of urinary retention.   He was seen in the ED on 03/25/2021 with complaints of constipation and urinary retention. He had not had a bowl movement for 2 days and was taking hydrocodone for chronic hip pain. He states he was experiencing urinary retention without dysuria, hematuria or discharge.  He has never had urinary retention before, a foley catheter was placed in triage and he reported feeling better. Urinalysis in ED showed negative nitrite, 30! Protein, and rare bacteria.   03/25/2021 CT abdomen and pelvis with contrast showed postop changes from previous right nephrectomy remain stable. No masses identified within the nephrectomy bed. The left kidney is normal in appearance. No evidence of ureteral calculi or hydronephrosis. Foley catheter is seen within the bladder, which was empty.   His most recent PSA was in 07/2019 and was 2.45.  He had a successful voiding trial today (see CMA note). He states that he was put on hydrocodone and started noticing constipation and then started experiencing urinary retention. He is no longer taking hydrocodone.   He states he only experiences weak stream in the morning and only gets up once a night, these urological symptoms have been going in for years.   He states he has a solitary kidney due to ? Locally advanced kidney cancer. This was done by Dr. Lucia Bitter in [place].  No recurrence.    PMH: Past Medical History:  Diagnosis Date   Diabetes mellitus without complication ([place])    Hyperlipidemia    Hypertension     Surgical History: No past surgical history on file.  Home Medications:  Allergies as of 04/04/2021        Reactions   Fluad Quadrivalent [influenza Vac A&b Sa Adj Quad] Other (See Comments)   Pt was out of work due to complications of flu shot in 1996 and 1964   Codeine    Latex         Medication List        Accurate as of April 04, 2021  9:44 AM. If you have any questions, ask your nurse or doctor.          STOP taking these medications    HYDROcodone-acetaminophen 5-325 MG tablet Commonly known as: NORCO/VICODIN Stopped by: Hollice Espy, MD       TAKE these medications    bisacodyl 10 MG suppository Commonly known as: DULCOLAX Place 1 suppository (10 mg total) rectally daily as needed for up to 12 days for moderate constipation.   escitalopram 20 MG tablet Commonly known as: LEXAPRO Take 20 mg by mouth daily.   insulin aspart 100 UNIT/ML FlexPen Commonly known as: NOVOLOG Use for sliding scale 3 times per day.  Max daily dose 50 units.   insulin lispro 100 UNIT/ML KwikPen Commonly known as: HUMALOG Use for sliding scale 3 times per day.  Max daily dose 50 units.   lactulose 10 GM/15ML solution Commonly known as: CHRONULAC Take 30 mLs (20 g total) by mouth 3 (three) times daily as needed for severe constipation (up to tid as needed, stop when BMs are soft/liquid).   Lantus SoloStar 100 UNIT/ML Solostar Pen Generic drug: insulin glargine Inject into the  skin.   losartan 25 MG tablet Commonly known as: COZAAR Take 25 mg by mouth daily.   modafinil 200 MG tablet Commonly known as: PROVIGIL Take 1 tablet by mouth daily.   pantoprazole 40 MG tablet Commonly known as: PROTONIX Take 40 mg by mouth daily.   pregabalin 75 MG capsule Commonly known as: LYRICA Take 75 mg by mouth 2 (two) times daily.   propranolol 40 MG tablet Commonly known as: INDERAL Take 40 mg by mouth 2 (two) times daily.   rosuvastatin 10 MG tablet Commonly known as: CRESTOR Take 10 mg by mouth daily.   tiZANidine 2 MG tablet Commonly known as: ZANAFLEX TAKE 1 TABLET BY  MOUTH NIGHTLY AS NEEDED.   traMADol 50 MG tablet Commonly known as: ULTRAM Take by mouth.   traZODone 100 MG tablet Commonly known as: DESYREL Take 200 mg by mouth at bedtime.        Allergies:  Allergies  Allergen Reactions   Fluad Quadrivalent [Influenza Vac A&B Sa Adj Quad] Other (See Comments)    Pt was out of work due to complications of flu shot in 1996 and 1964   Codeine    Latex     Family History: No family history on file.  Social History:  has no history on file for tobacco use, alcohol use, and drug use.   Physical Exam: BP (!) 154/62   Pulse 62   Ht '5\' 11"'$  (1.803 m)   Wt 235 lb (106.6 kg)   BMI 32.78 kg/m   Constitutional:  Alert and oriented, No acute distress.  Accompanied by wife today. HEENT: [place] AT, moist mucus membranes.  Trachea midline, no masses. Cardiovascular: No clubbing, cyanosis, or edema. Respiratory: Normal respiratory effort, no increased work of breathing. Rectal: Normal sphincter tone,  50  CC prostate, smooth no nodules, non tender Skin: No rashes, bruises or suspicious lesions. Neurologic: Grossly intact, no focal deficits, moving all 4 extremities. Psychiatric: Normal mood and affect.  Laboratory Data:  Lab Results  Component Value Date   CREATININE 1.34 (H) 03/24/2021    Pertinent Imaging: CLINICAL DATA:  Acute abdominal pain. Previous right nephrectomy for renal cell carcinoma.   EXAM: CT ABDOMEN AND PELVIS WITH CONTRAST   TECHNIQUE: Multidetector CT imaging of the abdomen and pelvis was performed using the standard protocol following bolus administration of intravenous contrast.   CONTRAST:  88m OMNIPAQUE IOHEXOL 350 MG/ML SOLN   COMPARISON:  CT on 01/02/2021, and MRI on 01/25/2021   FINDINGS: Lower Chest: No acute findings.   Hepatobiliary: Multiple hepatic cysts remains stable. A 3.1 cm low-attenuation lesion in the medial dome of the liver which was indeterminate on recent MRI remains stable as well. No  new liver lesions identified. Gallbladder is unremarkable. No evidence of biliary ductal dilatation.   Pancreas:  No mass or inflammatory changes.   Spleen: Within normal limits in size and appearance.   Adrenals/Urinary Tract: Postop changes from previous right nephrectomy remain stable. No masses identified within the nephrectomy bed. The left kidney is normal in appearance. No evidence of ureteral calculi or hydronephrosis. Foley catheter is seen within the bladder, which is empty.   Stomach/Bowel: No evidence of obstruction, inflammatory process or abnormal fluid collections. Normal appendix visualized. Large amount of stool is seen in the distal colon including the rectum.   Vascular/Lymphatic: No pathologically enlarged lymph nodes. No acute vascular findings. Aortic atherosclerotic calcification noted.   Reproductive:  Stable moderately enlarged prostate.   Other:  A small umbilical  hernia is again seen containing only fat.   Musculoskeletal:  No suspicious bone lesions identified.   IMPRESSION: No acute findings.   Stable postop changes from previous right nephrectomy. No evidence of recurrent or metastatic carcinoma.   Stable indeterminate low-attenuation lesion in the medial liver dome. Recommend continued follow-up by MRI as directed in recent abdomen MRI report of 01/25/2021).   Large stool burden noted in distal colon including the rectum; recommend clinical correlation for possible constipation or impaction.   Stable moderately enlarged prostate.   Stable small umbilical hernia containing only fat.   Aortic Atherosclerosis (ICD10-I70.0).     Electronically Signed   By: Marlaine Hind M.D.   On: 03/25/2021 09:38   CT scan images were personally reviewed, agree with above radiological interpretation  Assessment & Plan:    Urinary retention  - Secondary to mild underlying BPH exasperated by constipation and narcotics  - s/p voiding trial successful  today - Flomax prescribed for a week to assure he is emptying adequately, OK to take longer if feels that this is helpful or needed - single dose antibiotic prescribed for prophylaxis with VT - Rectal exam normal today as well as stable PSA. No worry for prostate cancer.   Renal cell carcinoma  - s/p nephrectomy  - NED  - Preformed by Dr Lucia Bitter    Follow-up with PA in 4 weeks for PVR  I,Kailey Littlejohn,acting as a scribe for Hollice Espy, MD.,have documented all relevant documentation on the behalf of Hollice Espy, MD,as directed by  Hollice Espy, MD while in the presence of Hollice Espy, MD.  I have reviewed the above documentation for accuracy and completeness, and I agree with the above.   Hollice Espy, MD  [facility] [address], [place], [place] [address] [phone]  I spent 45 total minutes on the day of the encounter including pre-visit review of the medical record, face-to-face time with the patient, and post visit ordering of labs/imaging/tests.

## 2021-04-03 NOTE — Progress Notes (Deleted)
04/04/2021 1:55 PM   Gregory Campbell 09-27-1940 [id number]  Referring provider: Kirk Ruths, MD [place] [facility] [place],  [place] [address]  No chief complaint on file.   HPI: ***   Reviewed referral notes.    PMH: Past Medical History:  Diagnosis Date   Diabetes mellitus without complication ([place])    Hyperlipidemia    Hypertension     Surgical History: No past surgical history on file.  Home Medications:  Allergies as of 04/04/2021       Reactions   Codeine    Latex         Medication List        Accurate as of April 03, 2021  1:55 PM. If you have any questions, ask your nurse or doctor.          bisacodyl 10 MG suppository Commonly known as: DULCOLAX Place 1 suppository (10 mg total) rectally daily as needed for up to 12 days for moderate constipation.   lactulose 10 GM/15ML solution Commonly known as: CHRONULAC Take 30 mLs (20 g total) by mouth 3 (three) times daily as needed for severe constipation (up to tid as needed, stop when BMs are soft/liquid).        Allergies:  Allergies  Allergen Reactions   Codeine    Latex     Family History: No family history on file.  Social History:  has no history on file for tobacco use, alcohol use, and drug use.  ROS: Pertinent ROS in HPI  Physical Exam: There were no vitals taken for this visit.  Constitutional:  Well nourished. Alert and oriented, No acute distress. HEENT: [place] AT, moist mucus membranes.  Trachea midline, no masses. Cardiovascular: No clubbing, cyanosis, or edema. Respiratory: Normal respiratory effort, no increased work of breathing. GI: Abdomen is soft, non tender, non distended, no abdominal masses. Liver and spleen not palpable.  No hernias appreciated.  Stool sample for occult testing is not indicated.   GU: No CVA tenderness.  No bladder fullness or masses.  Patient with circumcised/uncircumcised phallus. ***Foreskin easily  retracted***  Urethral meatus is patent.  No penile discharge. No penile lesions or rashes. Scrotum without lesions, cysts, rashes and/or edema.  Testicles are located scrotally bilaterally. No masses are appreciated in the testicles. Left and right epididymis are normal. Rectal: Patient with  normal sphincter tone. Anus and perineum without scarring or rashes. No rectal masses are appreciated. Prostate is approximately *** grams, *** nodules are appreciated. Seminal vesicles are normal. Skin: No rashes, bruises or suspicious lesions. Lymph: No cervical or inguinal adenopathy. Neurologic: Grossly intact, no focal deficits, moving all 4 extremities. Psychiatric: Normal mood and affect.  Laboratory Data: Lab Results  Component Value Date   WBC 8.0 03/24/2021   HGB 16.5 03/24/2021   HCT 49.7 03/24/2021   MCV 86.6 03/24/2021   PLT 166 03/24/2021    Lab Results  Component Value Date   CREATININE 1.34 (H) 03/24/2021    No results found for: PSA  No results found for: TESTOSTERONE  No results found for: HGBA1C  No results found for: TSH  No results found for: CHOL, HDL, CHOLHDL, VLDL, LDLCALC  Lab Results  Component Value Date   AST 29 08/23/2020   Lab Results  Component Value Date   ALT 25 08/23/2020   No components found for: ALKALINEPHOPHATASE No components found for: BILIRUBINTOTAL  No results found for: ESTRADIOL  Urinalysis    Component Value  Date/Time   COLORURINE YELLOW 03/24/2021 2111   APPEARANCEUR CLEAR 03/24/2021 2111   LABSPEC 1.015 03/24/2021 2111   PHURINE 5.0 03/24/2021 2111   GLUCOSEU 100 (A) 03/24/2021 2111   HGBUR NEGATIVE 03/24/2021 2111   [place] NEGATIVE 03/24/2021 2111   [place] 03/24/2021 2111   PROTEINUR 30 (A) 03/24/2021 2111   NITRITE NEGATIVE 03/24/2021 2111   LEUKOCYTESUR NEGATIVE 03/24/2021 2111    I have reviewed the labs.   Pertinent Imaging: '@CT'$ @ '@ultrasound'$ @ '@KUB'$ @ I have independently reviewed the films.     Assessment & Plan:  ***  1. Urinary retention ***  No follow-ups on file.  These notes generated with voice recognition software. I apologize for typographical errors.  Zara Council, PA-C  [facility] [address]  [place], [place] [address] [phone]

## 2021-04-04 ENCOUNTER — Other Ambulatory Visit: Payer: Self-pay

## 2021-04-04 ENCOUNTER — Ambulatory Visit: Payer: Medicare HMO | Admitting: Urology

## 2021-04-04 VITALS — BP 154/62 | HR 62 | Ht 71.0 in | Wt 235.0 lb

## 2021-04-04 DIAGNOSIS — R319 Hematuria, unspecified: Secondary | ICD-10-CM

## 2021-04-04 DIAGNOSIS — R339 Retention of urine, unspecified: Secondary | ICD-10-CM

## 2021-04-04 MED ORDER — SILDENAFIL CITRATE 20 MG PO TABS: ORAL_TABLET | ORAL | 3 refills | Status: DC

## 2021-04-04 MED ORDER — SULFAMETHOXAZOLE-TRIMETHOPRIM 800-160 MG PO TABS
1.0000 | ORAL_TABLET | Freq: Two times a day (BID) | ORAL | Status: DC
  Administered 2021-04-04: 1 via ORAL

## 2021-04-04 MED ORDER — TAMSULOSIN HCL 0.4 MG PO CAPS: 0.4000 mg | ORAL_CAPSULE | Freq: Every day | ORAL | 0 refills | Status: DC

## 2021-04-04 NOTE — Progress Notes (Signed)
Fill and Pull Catheter Removal  Patient is present today for a catheter removal.  Patient was cleaned and prepped in a sterile fashion 330 ml of sterile water/ saline was instilled into the bladder when the patient felt the urge to urinate. 10 ml of water was then drained from the balloon.  A 16FR foley cath was removed from the bladder no complications were noted .  Patient as then given some time to void on their own.  Patient can void  330 ml on their own after some time.  Patient tolerated well.  Performed by: Kerman Passey, RMA  Follow up/ Additional notes: as directed.

## 2021-04-11 DIAGNOSIS — E1122 Type 2 diabetes mellitus with diabetic chronic kidney disease: Secondary | ICD-10-CM | POA: Diagnosis not present

## 2021-04-11 DIAGNOSIS — Z794 Long term (current) use of insulin: Secondary | ICD-10-CM | POA: Diagnosis not present

## 2021-04-11 DIAGNOSIS — F3341 Major depressive disorder, recurrent, in partial remission: Secondary | ICD-10-CM | POA: Insufficient documentation

## 2021-04-11 DIAGNOSIS — G4733 Obstructive sleep apnea (adult) (pediatric): Secondary | ICD-10-CM | POA: Diagnosis not present

## 2021-04-11 DIAGNOSIS — I129 Hypertensive chronic kidney disease with stage 1 through stage 4 chronic kidney disease, or unspecified chronic kidney disease: Secondary | ICD-10-CM | POA: Diagnosis not present

## 2021-04-11 DIAGNOSIS — N1831 Chronic kidney disease, stage 3a: Secondary | ICD-10-CM | POA: Diagnosis not present

## 2021-04-28 ENCOUNTER — Other Ambulatory Visit: Payer: Self-pay | Admitting: Urology

## 2021-05-03 ENCOUNTER — Ambulatory Visit
Admission: RE | Admit: 2021-05-03 | Discharge: 2021-05-03 | Disposition: A | Discharge status: Home / Self Care | Payer: Medicare HMO | Source: Ambulatory Visit | Attending: Internal Medicine | Admitting: Internal Medicine

## 2021-05-03 ENCOUNTER — Ambulatory Visit
Admission: RE | Admit: 2021-05-03 | Discharge: 2021-05-03 | Disposition: A | Discharge status: Home / Self Care | Payer: Medicare HMO | Source: Ambulatory Visit | Attending: Orthopedic Surgery | Admitting: Orthopedic Surgery

## 2021-05-03 DIAGNOSIS — G8929 Other chronic pain: Secondary | ICD-10-CM

## 2021-05-03 DIAGNOSIS — M5441 Lumbago with sciatica, right side: Secondary | ICD-10-CM | POA: Diagnosis not present

## 2021-05-03 DIAGNOSIS — M5136 Other intervertebral disc degeneration, lumbar region: Secondary | ICD-10-CM | POA: Diagnosis not present

## 2021-05-03 DIAGNOSIS — K760 Fatty (change of) liver, not elsewhere classified: Secondary | ICD-10-CM | POA: Diagnosis not present

## 2021-05-03 DIAGNOSIS — I700 Atherosclerosis of aorta: Secondary | ICD-10-CM | POA: Diagnosis not present

## 2021-05-03 DIAGNOSIS — M48061 Spinal stenosis, lumbar region without neurogenic claudication: Secondary | ICD-10-CM | POA: Diagnosis not present

## 2021-05-03 DIAGNOSIS — K7689 Other specified diseases of liver: Secondary | ICD-10-CM | POA: Diagnosis not present

## 2021-05-03 DIAGNOSIS — Z905 Acquired absence of kidney: Secondary | ICD-10-CM | POA: Diagnosis not present

## 2021-05-03 MED ORDER — GADOBUTROL 1 MMOL/ML IV SOLN
10.0000 mL | Freq: Once | INTRAVENOUS | Status: AC | PRN
  Administered 2021-05-03: 10 mL via INTRAVENOUS

## 2021-05-08 DIAGNOSIS — K219 Gastro-esophageal reflux disease without esophagitis: Secondary | ICD-10-CM | POA: Insufficient documentation

## 2021-05-08 DIAGNOSIS — E291 Testicular hypofunction: Secondary | ICD-10-CM | POA: Insufficient documentation

## 2021-05-08 DIAGNOSIS — I10 Essential (primary) hypertension: Secondary | ICD-10-CM | POA: Insufficient documentation

## 2021-05-08 DIAGNOSIS — G4700 Insomnia, unspecified: Secondary | ICD-10-CM | POA: Insufficient documentation

## 2021-05-08 DIAGNOSIS — N529 Male erectile dysfunction, unspecified: Secondary | ICD-10-CM | POA: Insufficient documentation

## 2021-05-08 DIAGNOSIS — G43909 Migraine, unspecified, not intractable, without status migrainosus: Secondary | ICD-10-CM | POA: Insufficient documentation

## 2021-05-08 DIAGNOSIS — E1142 Type 2 diabetes mellitus with diabetic polyneuropathy: Secondary | ICD-10-CM | POA: Insufficient documentation

## 2021-05-08 NOTE — Progress Notes (Deleted)
05/09/2021 11:45 AM   Gregory Campbell 23-Nov-1940 [id number]  Referring provider: Kirk Ruths, MD [facility] [place],  [place] [address]  No chief complaint on file.  Urological history: 1. Urinary retention -secondary to constipation due to chronic opioid use -passed TOV 03/2021  2. Renal cell carcinoma  -right nephrectomy 2012 -no recurrence on MRI 04/2021  3. BPH with retention/LU TS -I PSS *** -PVR *** -managed with tamsulosin 0.4 mg daily  HPI: Gregory Campbell is a 80 y.o. male who presents today for a one month follow up after a successful TOV.     Score:  1-7 Mild 8-19 Moderate 20-35 Severe   PMH: Past Medical History:  Diagnosis Date   Diabetes mellitus without complication ([place])    Hyperlipidemia    Hypertension     Surgical History: No past surgical history on file.  Home Medications:  Allergies as of 05/09/2021       Reactions   Fluad Quadrivalent [influenza Vac A&b Sa Adj Quad] Other (See Comments)   Pt was out of work due to complications of flu shot in 1996 and 1964   Codeine    Latex         Medication List        Accurate as of May 08, 2021 11:45 AM. If you have any questions, ask your nurse or doctor.          escitalopram 20 MG tablet Commonly known as: LEXAPRO Take 20 mg by mouth daily.   insulin aspart 100 UNIT/ML FlexPen Commonly known as: NOVOLOG Use for sliding scale 3 times per day.  Max daily dose 50 units.   insulin lispro 100 UNIT/ML KwikPen Commonly known as: HUMALOG Use for sliding scale 3 times per day.  Max daily dose 50 units.   lactulose 10 GM/15ML solution Commonly known as: CHRONULAC Take 30 mLs (20 g total) by mouth 3 (three) times daily as needed for severe constipation (up to tid as needed, stop when BMs are soft/liquid).   Lantus SoloStar 100 UNIT/ML Solostar Pen Generic drug: insulin glargine Inject into the skin.   losartan 25 MG  tablet Commonly known as: COZAAR Take 25 mg by mouth daily.   modafinil 200 MG tablet Commonly known as: PROVIGIL Take 1 tablet by mouth daily.   pantoprazole 40 MG tablet Commonly known as: PROTONIX Take 40 mg by mouth daily.   pregabalin 75 MG capsule Commonly known as: LYRICA Take 75 mg by mouth 2 (two) times daily.   propranolol 40 MG tablet Commonly known as: INDERAL Take 40 mg by mouth 2 (two) times daily.   rosuvastatin 10 MG tablet Commonly known as: CRESTOR Take 10 mg by mouth daily.   tamsulosin 0.4 MG Caps capsule Commonly known as: FLOMAX TAKE 1 CAPSULE BY MOUTH EVERY DAY   tiZANidine 2 MG tablet Commonly known as: ZANAFLEX TAKE 1 TABLET BY MOUTH NIGHTLY AS NEEDED.   traMADol 50 MG tablet Commonly known as: ULTRAM Take by mouth.   traZODone 100 MG tablet Commonly known as: DESYREL Take 200 mg by mouth at bedtime.        Allergies:  Allergies  Allergen Reactions   Fluad Quadrivalent [Influenza Vac A&B Sa Adj Quad] Other (See Comments)    Pt was out of work due to complications of flu shot in 1996 and 1964   Codeine    Latex     Family History: No family history on file.  Social History:  has no history on file for tobacco use, alcohol use, and drug use.  ROS: Pertinent ROS in HPI  Physical Exam: There were no vitals taken for this visit.  Constitutional:  Well nourished. Alert and oriented, No acute distress. HEENT: [place] AT, moist mucus membranes.  Trachea midline, no masses. Cardiovascular: No clubbing, cyanosis, or edema. Respiratory: Normal respiratory effort, no increased work of breathing. GI: Abdomen is soft, non tender, non distended, no abdominal masses. Liver and spleen not palpable.  No hernias appreciated.  Stool sample for occult testing is not indicated.   GU: No CVA tenderness.  No bladder fullness or masses.  Patient with circumcised/uncircumcised phallus. ***Foreskin easily retracted***  Urethral meatus is patent.  No penile  discharge. No penile lesions or rashes. Scrotum without lesions, cysts, rashes and/or edema.  Testicles are located scrotally bilaterally. No masses are appreciated in the testicles. Left and right epididymis are normal. Rectal: Patient with  normal sphincter tone. Anus and perineum without scarring or rashes. No rectal masses are appreciated. Prostate is approximately *** grams, *** nodules are appreciated. Seminal vesicles are normal. Skin: No rashes, bruises or suspicious lesions. Lymph: No cervical or inguinal adenopathy. Neurologic: Grossly intact, no focal deficits, moving all 4 extremities. Psychiatric: Normal mood and affect.  Laboratory Data: Lab Results  Component Value Date   WBC 8.0 03/24/2021   HGB 16.5 03/24/2021   HCT 49.7 03/24/2021   MCV 86.6 03/24/2021   PLT 166 03/24/2021    Lab Results  Component Value Date   CREATININE 1.34 (H) 03/24/2021    No results found for: PSA  No results found for: TESTOSTERONE  No results found for: HGBA1C  No results found for: TSH  No results found for: CHOL, HDL, CHOLHDL, VLDL, LDLCALC  Lab Results  Component Value Date   AST 29 08/23/2020   Lab Results  Component Value Date   ALT 25 08/23/2020   No components found for: ALKALINEPHOPHATASE No components found for: BILIRUBINTOTAL  No results found for: ESTRADIOL  Urinalysis    Component Value Date/Time   COLORURINE YELLOW 03/24/2021 2111   APPEARANCEUR CLEAR 03/24/2021 2111   LABSPEC 1.015 03/24/2021 2111   PHURINE 5.0 03/24/2021 2111   GLUCOSEU 100 (A) 03/24/2021 2111   [place] NEGATIVE 03/24/2021 2111   [place] NEGATIVE 03/24/2021 2111   [place] NEGATIVE 03/24/2021 2111   PROTEINUR 30 (A) 03/24/2021 2111   NITRITE NEGATIVE 03/24/2021 2111   LEUKOCYTESUR NEGATIVE 03/24/2021 2111    I have reviewed the labs.   Pertinent Imaging: @CT @ @ultrasound @ @KUB @ I have independently reviewed the films.    Assessment & Plan:  ***  1. BPH with LUTS -PSA  stable -DRE benign -UA benign -PVR < 300 cc -symptoms - *** -most bothersome symptoms are *** -continue conservative management, avoiding bladder irritants and timed voiding's -Initiate alpha-blocker (***), discussed side effects *** -Initiate 5 alpha reductase inhibitor (***), discussed side effects *** -Continue tamsulosin 0.4 mg daily, alfuzosin 10 mg daily, Rapaflo 8 mg daily, terazosin, doxazosin, Cialis 5 mg daily and finasteride 5 mg daily, dutasteride 0.5 mg daily***:refills given -Cannot tolerate medication or medication failure, schedule cystoscopy ***  2. Urinary retention ***     No follow-ups on file.  These notes generated with voice recognition software. I apologize for typographical errors.  Zara Council, PA-C  [facility] [address]  [place], [place] [address] [phone]

## 2021-05-09 ENCOUNTER — Ambulatory Visit: Payer: Medicare HMO | Admitting: Urology

## 2021-05-09 DIAGNOSIS — R339 Retention of urine, unspecified: Secondary | ICD-10-CM

## 2021-05-09 DIAGNOSIS — N138 Other obstructive and reflux uropathy: Secondary | ICD-10-CM

## 2021-05-10 ENCOUNTER — Other Ambulatory Visit: Payer: Self-pay | Admitting: Internal Medicine

## 2021-05-10 ENCOUNTER — Other Ambulatory Visit (HOSPITAL_BASED_OUTPATIENT_CLINIC_OR_DEPARTMENT_OTHER): Payer: Self-pay | Admitting: Internal Medicine

## 2021-05-10 DIAGNOSIS — K7689 Other specified diseases of liver: Secondary | ICD-10-CM

## 2021-05-14 ENCOUNTER — Other Ambulatory Visit: Payer: Self-pay | Admitting: Urology

## 2021-05-14 NOTE — Progress Notes (Signed)
05/15/2021 4:19 PM   Gregory Campbell 1941-07-06 [id number]  Referring provider: Kirk Ruths, MD [facility] [place],  [place] [address]  Chief Complaint  Patient presents with   Benign Prostatic Hypertrophy   Urological history: 1. Urinary retention -secondary to constipation due to chronic opioid use -passed TOV 03/2021  2. Renal cell carcinoma  -right nephrectomy 2012 -no recurrence on MRI 04/2021  3. BPH with retention/LU TS -I PSS 6/1 -PVR 74 mL -managed with tamsulosin 0.4 mg daily  HPI: Gregory Campbell is a 80 y.o. male who presents today for a one month follow up after a successful TOV.   He states he has intermittent dysuria which he states is a side effect of one of his medications.  He has also been experiencing a split urinary stream since the catheter was removed.  Patient denies any modifying or aggravating factors.  Patient denies any gross hematuria or suprapubic/flank pain.  Patient denies any fevers, chills, nausea or vomiting.    UA nitrite positive, > 30 WBC's and many bacteria   PVR 74 mL   IPSS     Row Name 05/15/21 1100         International Prostate Symptom Score   How often have you had the sensation of not emptying your bladder? Less than 1 in 5     How often have you had to urinate less than every two hours? Not at All     How often have you found you stopped and started again several times when you urinated? Less than half the time     How often have you found it difficult to postpone urination? Less than 1 in 5 times     How often have you had a weak urinary stream? Less than 1 in 5 times     How often have you had to strain to start urination? Not at All     How many times did you typically get up at night to urinate? 1 Time     Total IPSS Score 6       Quality of Life due to urinary symptoms   If you were to spend the rest of your life with your urinary condition just the way it is now how  would you feel about that? Pleased              Score:  1-7 Mild 8-19 Moderate 20-35 Severe   PMH: Past Medical History:  Diagnosis Date   Diabetes mellitus without complication ([place])    Hyperlipidemia    Hypertension     Surgical History: History reviewed. No pertinent surgical history.  Home Medications:  Allergies as of 05/15/2021       Reactions   Fluad Quadrivalent [influenza Vac A&b Sa Adj Quad] Other (See Comments)   Pt was out of work due to complications of flu shot in 1996 and 1964   Codeine    Latex         Medication List        Accurate as of May 15, 2021 11:59 PM. If you have any questions, ask your nurse or doctor.          STOP taking these medications    insulin lispro 100 UNIT/ML KwikPen Commonly known as: HUMALOG Stopped by: Eddith Mentor, PA-C   lactulose 10 GM/15ML solution Commonly known as: CHRONULAC Stopped by: Jeremiyah Cullens, PA-C   modafinil 200 MG tablet Commonly known  as: PROVIGIL Stopped by: Zara Council, PA-C   pantoprazole 40 MG tablet Commonly known as: PROTONIX Stopped by: Evanie Buckle, PA-C   propranolol 40 MG tablet Commonly known as: INDERAL Stopped by: Linzie Criss, PA-C   rosuvastatin 10 MG tablet Commonly known as: CRESTOR Stopped by: Dyllen Menning, PA-C   traMADol 50 MG tablet Commonly known as: ULTRAM Stopped by: Zara Council, PA-C       TAKE these medications    escitalopram 20 MG tablet Commonly known as: LEXAPRO Take 20 mg by mouth daily.   insulin aspart 100 UNIT/ML FlexPen Commonly known as: NOVOLOG Use for sliding scale 3 times per day.  Max daily dose 50 units.   Lantus SoloStar 100 UNIT/ML Solostar Pen Generic drug: insulin glargine Inject into the skin.   losartan 25 MG tablet Commonly known as: COZAAR Take 25 mg by mouth daily.   pregabalin 75 MG capsule Commonly known as: LYRICA Take 75 mg by mouth 2 (two) times daily.   tamsulosin 0.4 MG Caps  capsule Commonly known as: FLOMAX TAKE 1 CAPSULE BY MOUTH EVERY DAY What changed: Another medication with the same name was removed. Continue taking this medication, and follow the directions you see here. Changed by: Zara Council, PA-C   tiZANidine 2 MG tablet Commonly known as: ZANAFLEX TAKE 1 TABLET BY MOUTH NIGHTLY AS NEEDED.   traZODone 100 MG tablet Commonly known as: DESYREL Take 200 mg by mouth at bedtime.        Allergies:  Allergies  Allergen Reactions   Fluad Quadrivalent [Influenza Vac A&B Sa Adj Quad] Other (See Comments)    Pt was out of work due to complications of flu shot in 1996 and 1964   Codeine    Latex     Family History: History reviewed. No pertinent family history.  Social History:  has no history on file for tobacco use, alcohol use, and drug use.  ROS: Pertinent ROS in HPI  Physical Exam: BP (!) 167/75   Pulse (!) 52   Ht 5\' 11"  (1.803 m)   Wt 246 lb 12.8 oz (111.9 kg)   BMI 34.42 kg/m   Constitutional:  Well nourished. Alert and oriented, No acute distress. HEENT: [place] AT, mask in place.  Trachea midline Cardiovascular: No clubbing, cyanosis, or edema. Respiratory: Normal respiratory effort, no increased work of breathing. Neurologic: Grossly intact, no focal deficits, moving all 4 extremities. Psychiatric: Normal mood and affect.  Laboratory Data: Lab Results  Component Value Date   WBC 8.0 03/24/2021   HGB 16.5 03/24/2021   HCT 49.7 03/24/2021   MCV 86.6 03/24/2021   PLT 166 03/24/2021    Lab Results  Component Value Date   CREATININE 1.34 (H) 03/24/2021    Lab Results  Component Value Date   AST 29 08/23/2020   Lab Results  Component Value Date   ALT 25 08/23/2020   Urinalysis Component     Latest Ref Rng & Units 05/15/2021  Specific Gravity, UA     1.005 - 1.030 1.015  pH, UA     5.0 - 7.5 5.5  Color, UA     Yellow Yellow  Appearance Ur     Clear Cloudy (A)  Leukocytes,UA     Negative 1+ (A)   Protein,UA     Negative/Trace Trace (A)  Glucose, UA     Negative Negative  Ketones, UA     Negative Negative  RBC, UA     Negative Trace (A)  Bilirubin, UA  Negative Negative  Urobilinogen, Ur     0.2 - 1.0 mg/dL 0.2  Nitrite, UA     Negative Positive (A)  Microscopic Examination      See below:   Component     Latest Ref Rng & Units 05/15/2021  WBC, UA     0 - 5 /hpf >30 (A)  RBC     0 - 2 /hpf 0-2  Epithelial Cells (non renal)     0 - 10 /hpf 0-10  Casts     None seen /lpf Present (A)  Cast Type     N/A Granular casts (A)  Bacteria, UA     None seen/Few Many (A)   I have reviewed the labs.   Pertinent Imaging: Results for KENLY, XIAO (MRN [id number]) as of 05/15/2021 11:31  Ref. Range 05/15/2021 11:13  Scan Result Unknown 74    Assessment & Plan:    1. BPH with LUTS -PVR < 300 cc -symptoms - intermittent dysuria and spraying of urinary stream -explained that the dysuria may also be concerning for CIS and we need to investigate further and I will send urine for culture and likely cysto in the future -continue conservative management, avoiding bladder irritants and timed voiding's -UA -urine sent for culture to rule out indolent infection -if urine culture is negative, will schedule cysto -If urine culture is positive, will treat and reassess symptoms -if symptoms persist, will schedule cysto  -I have explained to the patient that they will  be scheduled for a cystoscopy in our office to evaluate their bladder.  The cystoscopy consists of passing a tube with a lens up through their urethra and into their urinary bladder.   We will inject the urethra with a lidocaine gel prior to introducing the cystoscope to help with any discomfort during the procedure.   After the procedure, they might experience blood in the urine and discomfort with urination.  This will abate after the first few voids.  I have  encouraged the patient to increase water intake  during  this time.  Patient denies any allergies to lidocaine.     2. Urinary retention -resolved    Return for pending urine cutlure results .  These notes generated with voice recognition software. I apologize for typographical errors.  Zara Council, PA-C  [facility] [address]  [place], [place] [address] [phone]

## 2021-05-15 ENCOUNTER — Other Ambulatory Visit: Payer: Self-pay

## 2021-05-15 ENCOUNTER — Ambulatory Visit: Payer: Medicare HMO | Admitting: Urology

## 2021-05-15 ENCOUNTER — Other Ambulatory Visit: Payer: Self-pay | Admitting: Urology

## 2021-05-15 ENCOUNTER — Encounter: Payer: Self-pay | Admitting: Urology

## 2021-05-15 VITALS — BP 167/75 | HR 52 | Ht 71.0 in | Wt 246.8 lb

## 2021-05-15 DIAGNOSIS — N401 Enlarged prostate with lower urinary tract symptoms: Secondary | ICD-10-CM | POA: Diagnosis not present

## 2021-05-15 DIAGNOSIS — N138 Other obstructive and reflux uropathy: Secondary | ICD-10-CM

## 2021-05-15 DIAGNOSIS — R339 Retention of urine, unspecified: Secondary | ICD-10-CM | POA: Diagnosis not present

## 2021-05-15 LAB — MICROSCOPIC EXAMINATION: WBC, UA: >30 /hpf — AB (ref 0–5)

## 2021-05-15 LAB — URINALYSIS, COMPLETE
Bilirubin, UA: NEGATIVE
Glucose, UA: NEGATIVE
Ketones, UA: NEGATIVE
Nitrite, UA: POSITIVE — AB
Specific Gravity, UA: 1.015 (ref 1.005–1.030)
Urobilinogen, Ur: 0.2 mg/dL (ref 0.2–1.0)
pH, UA: 5.5 (ref 5.0–7.5)

## 2021-05-15 LAB — BLADDER SCAN AMB NON-IMAGING: Scan Result: 74

## 2021-05-17 DIAGNOSIS — E1122 Type 2 diabetes mellitus with diabetic chronic kidney disease: Secondary | ICD-10-CM | POA: Diagnosis not present

## 2021-05-18 ENCOUNTER — Telehealth: Payer: Self-pay

## 2021-05-18 LAB — CULTURE, URINE COMPREHENSIVE

## 2021-05-18 MED ORDER — SULFAMETHOXAZOLE-TRIMETHOPRIM 800-160 MG PO TABS: 1.0000 | ORAL_TABLET | Freq: Two times a day (BID) | ORAL | 0 refills | Status: DC

## 2021-05-18 NOTE — Telephone Encounter (Signed)
-----   Message from Nori Riis, PA-C sent at 05/18/2021 10:38 AM EDT ----- Please let Mr. Barra know that his urine culture was positive for infection.  I would like for him to start Septra DS, twice daily for seven days and then we need to reassess his symptoms, so I would like to see him again in two to three weeks.

## 2021-05-18 NOTE — Telephone Encounter (Signed)
Pt aware , verbalized understanding. Appt scheduled. Medication sent to pharmacy.

## 2021-05-24 DIAGNOSIS — M5126 Other intervertebral disc displacement, lumbar region: Secondary | ICD-10-CM | POA: Diagnosis not present

## 2021-05-24 DIAGNOSIS — M5416 Radiculopathy, lumbar region: Secondary | ICD-10-CM | POA: Diagnosis not present

## 2021-06-04 NOTE — Progress Notes (Signed)
06/05/2021 5:30 PM   Gregory Campbell 1941-04-16 [id number]  Referring provider: Kirk Ruths, MD [facility] [place],  [place] [address]  Chief Complaint  Patient presents with   Benign Prostatic Hypertrophy    Urological history: 1. Urinary retention -secondary to constipation due to chronic opioid use -passed TOV 03/2021  2. Renal cell carcinoma  -right nephrectomy 2012 -no recurrence on MRI 04/2021  3. BPH with retention/LU TS -I PSS 5/0 -PVR 15 mL -managed with tamsulosin 0.4 mg daily  HPI: Gregory Campbell is a 80 y.o. male who presents today for a follow up for a split urinary stream.  He as last seen on 05/15/2021.   He was found to have an UTI with urine culture positive for E.coli.  He has completed a course of culture appropriate antibiotics.    UA nitrite positive, greater than 30 WBCs and many bacteria  PVR 15 mL  He is taking the tamsulosin 0.4 mg daily.   He is no longer experiencing the dysuria, but he continues to have spraying of the urinary stream.  Patient denies any modifying or aggravating factors.  Patient denies any gross hematuria, dysuria or suprapubic/flank pain.  Patient denies any fevers, chills, nausea or vomiting.     IPSS     Row Name 06/05/21 1000         International Prostate Symptom Score   How often have you had the sensation of not emptying your bladder? Not at All     How often have you had to urinate less than every two hours? Less than 1 in 5 times     How often have you found you stopped and started again several times when you urinated? Less than 1 in 5 times     How often have you found it difficult to postpone urination? Not at All     How often have you had a weak urinary stream? Less than half the time     How often have you had to strain to start urination? Not at All     How many times did you typically get up at night to urinate? 1 Time     Total IPSS Score 5        Quality of Life due to urinary symptoms   If you were to spend the rest of your life with your urinary condition just the way it is now how would you feel about that? Delighted               Score:  1-7 Mild 8-19 Moderate 20-35 Severe   PMH: Past Medical History:  Diagnosis Date   Diabetes mellitus without complication ([place])    Hyperlipidemia    Hypertension     Surgical History: History reviewed. No pertinent surgical history.  Home Medications:  Allergies as of 06/05/2021       Reactions   Fluad Quadrivalent [influenza Vac A&b Sa Adj Quad] Other (See Comments)   Pt was out of work due to complications of flu shot in 1996 and 1964   Codeine    Latex         Medication List        Accurate as of June 05, 2021 11:59 PM. If you have any questions, ask your nurse or doctor.          busPIRone 15 MG tablet Commonly known as: BUSPAR   escitalopram 20 MG tablet Commonly known as:  LEXAPRO Take 20 mg by mouth daily.   insulin aspart 100 UNIT/ML FlexPen Commonly known as: NOVOLOG Use for sliding scale 3 times per day.  Max daily dose 50 units.   Lantus SoloStar 100 UNIT/ML Solostar Pen Generic drug: insulin glargine Inject into the skin.   losartan 25 MG tablet Commonly known as: COZAAR Take 25 mg by mouth daily.   pregabalin 75 MG capsule Commonly known as: LYRICA Take 75 mg by mouth 2 (two) times daily.   sulfamethoxazole-trimethoprim 800-160 MG tablet Commonly known as: BACTRIM DS Take 1 tablet by mouth every 12 (twelve) hours.   tamsulosin 0.4 MG Caps capsule Commonly known as: FLOMAX TAKE 1 CAPSULE BY MOUTH EVERY DAY   tiZANidine 2 MG tablet Commonly known as: ZANAFLEX TAKE 1 TABLET BY MOUTH NIGHTLY AS NEEDED.   traZODone 100 MG tablet Commonly known as: DESYREL Take 200 mg by mouth at bedtime.        Allergies:  Allergies  Allergen Reactions   Fluad Quadrivalent [Influenza Vac A&B Sa Adj Quad] Other (See Comments)    Pt was  out of work due to complications of flu shot in 1996 and 1964   Codeine    Latex     Family History: History reviewed. No pertinent family history.  Social History:  has no history on file for tobacco use, alcohol use, and drug use.  ROS: Pertinent ROS in HPI  Physical Exam: BP 134/72   Pulse 65   Ht 5\' 11"  (1.803 m)   Wt 235 lb (106.6 kg)   BMI 32.78 kg/m   Constitutional:  Well nourished. Alert and oriented, No acute distress. HEENT: [place] AT, mask in place.  Trachea midline Cardiovascular: No clubbing, cyanosis, or edema. Respiratory: Normal respiratory effort, no increased work of breathing. Neurologic: Grossly intact, no focal deficits, moving all 4 extremities. Psychiatric: Normal mood and affect.   Laboratory Data: Urinalysis Component     Latest Ref Rng & Units 06/05/2021  Specific Gravity, UA     1.005 - 1.030 1.025  pH, UA     5.0 - 7.5 5.0  Color, UA     Yellow Yellow  Appearance Ur     Clear Cloudy (A)  Leukocytes,UA     Negative 2+ (A)  Protein,UA     Negative/Trace 1+ (A)  Glucose, UA     Negative Negative  Ketones, UA     Negative Trace (A)  RBC, UA     Negative Trace (A)  Bilirubin, UA     Negative Negative  Urobilinogen, Ur     0.2 - 1.0 mg/dL 0.2  Nitrite, UA     Negative Positive (A)  Microscopic Examination      See below:   Component     Latest Ref Rng & Units 06/05/2021  WBC, UA     0 - 5 /hpf >30 (H)  RBC     0 - 2 /hpf None seen  Epithelial Cells (non renal)     0 - 10 /hpf 0-10  Bacteria, UA     None seen/Few Many (A)  I have reviewed the labs.   Pertinent Imaging: Results for Gregory, Campbell (MRN [id number]) as of 06/05/2021 10:50  Ref. Range 06/05/2021 10:27  Scan Result Unknown 15     Assessment & Plan:    1. Spraying of the urinary stream -offered the patient a cystoscopy at this time to evaluate for stricture disease, but he deferred -as the UA is still grossly infected,  he would like to try another course of  antibiotics prior to undergoing a cystoscopy -explained that antibiotics would not address any scar tissue/stricture disease in the urethra and this would further delay his treatment -he would like to try another course of antibiotics at this time -urine sent for culture - will prescribe an antibiotic once culture results are available -if culture is negative or a repeat course of antibiotics does not improve symptoms, he has agreed to undergo the cysto   2. BPH with LUTS -PVR < 300 cc -continue tamsulosin 0.4 mg daily    2. Urinary retention -resolved    Return for pending urine cutlure results .  These notes generated with voice recognition software. I apologize for typographical errors.  Gregory Council, PA-C  [facility] [address]  [place], [place] [address] [phone]

## 2021-06-05 ENCOUNTER — Other Ambulatory Visit: Payer: Self-pay

## 2021-06-05 ENCOUNTER — Encounter: Payer: Self-pay | Admitting: Urology

## 2021-06-05 ENCOUNTER — Ambulatory Visit: Payer: Medicare HMO | Admitting: Urology

## 2021-06-05 VITALS — BP 134/72 | HR 65 | Ht 71.0 in | Wt 235.0 lb

## 2021-06-05 DIAGNOSIS — N401 Enlarged prostate with lower urinary tract symptoms: Secondary | ICD-10-CM

## 2021-06-05 DIAGNOSIS — R39198 Other difficulties with micturition: Secondary | ICD-10-CM

## 2021-06-05 DIAGNOSIS — N138 Other obstructive and reflux uropathy: Secondary | ICD-10-CM | POA: Diagnosis not present

## 2021-06-05 LAB — MICROSCOPIC EXAMINATION
RBC, Urine: NONE SEEN /hpf (ref 0–2)
WBC, UA: >30 /hpf — ABNORMAL HIGH (ref 0–5)

## 2021-06-05 LAB — URINALYSIS, COMPLETE
Bilirubin, UA: NEGATIVE
Glucose, UA: NEGATIVE
Nitrite, UA: POSITIVE — AB
Specific Gravity, UA: 1.025 (ref 1.005–1.030)
Urobilinogen, Ur: 0.2 mg/dL (ref 0.2–1.0)
pH, UA: 5 (ref 5.0–7.5)

## 2021-06-05 LAB — BLADDER SCAN AMB NON-IMAGING: Scan Result: 15

## 2021-06-08 ENCOUNTER — Other Ambulatory Visit: Payer: Self-pay | Admitting: *Deleted

## 2021-06-08 LAB — CULTURE, URINE COMPREHENSIVE

## 2021-06-08 MED ORDER — SULFAMETHOXAZOLE-TRIMETHOPRIM 800-160 MG PO TABS: 1.0000 | ORAL_TABLET | Freq: Two times a day (BID) | ORAL | 0 refills | Status: AC

## 2021-06-18 DIAGNOSIS — M5126 Other intervertebral disc displacement, lumbar region: Secondary | ICD-10-CM | POA: Diagnosis not present

## 2021-06-18 DIAGNOSIS — M5136 Other intervertebral disc degeneration, lumbar region: Secondary | ICD-10-CM | POA: Diagnosis not present

## 2021-06-18 DIAGNOSIS — M5416 Radiculopathy, lumbar region: Secondary | ICD-10-CM | POA: Diagnosis not present

## 2021-06-21 NOTE — Progress Notes (Signed)
06/22/2021 11:15 AM   Elenore Rota [place] 07/17/1941 [id number]  Referring provider: Kirk Ruths, MD [facility] [place],  [place] [address]  Chief Complaint  Patient presents with   Benign Prostatic Hypertrophy    Urological history: 1. Urinary retention -secondary to constipation due to chronic opioid use -passed TOV 03/2021  2. Renal cell carcinoma  -right nephrectomy 2012 -no recurrence on MRI 04/2021  3. BPH with retention/LU TS -I PSS 8/0 -PVR 15 mL -managed with tamsulosin 0.4 mg daily  HPI: Gregory Campbell is a 80 y.o. male who presents today for a follow up for a split urinary stream.  He is taking the tamsulosin 0.4 mg daily.   He presented 1 month ago for dysuria, splitting of urinary stream and a weak urinary stream.  Urine culture was positive for E. coli and he was treated with culture appropriate antibiotics.    Upon return visit the dysuria had abated, but he was still experiencing a split urinary stream.  I discussed with him undergoing cystoscopy, but he wanted to have another trial of antibiotics to see if his symptoms will completely abate.  Urine culture from that visit was positive for E. coli and he was treated with culture appropriate antibiotics.  Today, he states he is back to normal and urinating without issue.  Patient denies any modifying or aggravating factors.  Patient denies any gross hematuria, dysuria or suprapubic/flank pain.  Patient denies any fevers, chills, nausea or vomiting.      IPSS     Row Name 06/22/21 1000         International Prostate Symptom Score   How often have you had the sensation of not emptying your bladder? Not at All     How often have you had to urinate less than every two hours? Less than 1 in 5 times     How often have you found you stopped and started again several times when you urinated? About half the time     How often have you found it difficult to postpone  urination? Less than 1 in 5 times     How often have you had a weak urinary stream? Less than half the time     How often have you had to strain to start urination? Not at All     How many times did you typically get up at night to urinate? 1 Time     Total IPSS Score 8       Quality of Life due to urinary symptoms   If you were to spend the rest of your life with your urinary condition just the way it is now how would you feel about that? Delighted                Score:  1-7 Mild 8-19 Moderate 20-35 Severe   PMH: Past Medical History:  Diagnosis Date   Diabetes mellitus without complication ([place])    Hyperlipidemia    Hypertension     Surgical History: No past surgical history on file.  Home Medications:  Allergies as of 06/22/2021       Reactions   Codeine    Latex         Medication List        Accurate as of June 22, 2021 11:59 PM. If you have any questions, ask your nurse or doctor.          busPIRone 15 MG  tablet Commonly known as: BUSPAR   escitalopram 20 MG tablet Commonly known as: LEXAPRO Take 20 mg by mouth daily.   insulin aspart 100 UNIT/ML FlexPen Commonly known as: NOVOLOG Use for sliding scale 3 times per day.  Max daily dose 50 units.   Lantus SoloStar 100 UNIT/ML Solostar Pen Generic drug: insulin glargine Inject into the skin.   losartan 25 MG tablet Commonly known as: COZAAR Take 25 mg by mouth daily.   pregabalin 75 MG capsule Commonly known as: LYRICA Take 75 mg by mouth 2 (two) times daily.   tamsulosin 0.4 MG Caps capsule Commonly known as: FLOMAX TAKE 1 CAPSULE BY MOUTH EVERY DAY   tiZANidine 2 MG tablet Commonly known as: ZANAFLEX TAKE 1 TABLET BY MOUTH NIGHTLY AS NEEDED.   traZODone 100 MG tablet Commonly known as: DESYREL Take 200 mg by mouth at bedtime.        Allergies:  Allergies  Allergen Reactions   Codeine    Latex     Family History: No family history on file.  Social History:   reports that he has never smoked. He has never used smokeless tobacco. No history on file for alcohol use and drug use.  ROS: Pertinent ROS in HPI  Physical Exam: BP 129/71   Pulse 84   Ht 5\' 11"  (1.803 m)   Wt 235 lb (106.6 kg)   BMI 32.78 kg/m   Constitutional:  Well nourished. Alert and oriented, No acute distress. HEENT: [place] AT, mask in place.  Trachea midline Cardiovascular: No clubbing, cyanosis, or edema. Respiratory: Normal respiratory effort, no increased work of breathing. Neurologic: Grossly intact, no focal deficits, moving all 4 extremities. Psychiatric: Normal mood and affect.   Laboratory Data: N/A  Pertinent Imaging: N/A    Assessment & Plan:    1. Spraying of the urinary stream -resolved after two courses of antibiotics  2. BPH with LUTS -PVR < 300 cc -continue tamsulosin 0.4 mg daily   2. Urinary retention -resolved    Return in about 1 year (around 06/22/2022) for IPSS, PVR and exam.  These notes generated with voice recognition software. I apologize for typographical errors.  Zara Council, PA-C  [facility] [address]  [place], [place] [address] [phone]

## 2021-06-22 ENCOUNTER — Other Ambulatory Visit: Payer: Self-pay

## 2021-06-22 ENCOUNTER — Encounter: Payer: Self-pay | Admitting: Urology

## 2021-06-22 ENCOUNTER — Ambulatory Visit: Payer: Medicare HMO | Admitting: Urology

## 2021-06-22 VITALS — BP 129/71 | HR 84 | Ht 71.0 in | Wt 235.0 lb

## 2021-06-22 DIAGNOSIS — N401 Enlarged prostate with lower urinary tract symptoms: Secondary | ICD-10-CM | POA: Diagnosis not present

## 2021-06-22 DIAGNOSIS — R39198 Other difficulties with micturition: Secondary | ICD-10-CM

## 2021-06-22 DIAGNOSIS — N138 Other obstructive and reflux uropathy: Secondary | ICD-10-CM | POA: Diagnosis not present

## 2021-07-18 DIAGNOSIS — M5416 Radiculopathy, lumbar region: Secondary | ICD-10-CM | POA: Diagnosis not present

## 2021-07-18 DIAGNOSIS — M5459 Other low back pain: Secondary | ICD-10-CM | POA: Diagnosis not present

## 2021-07-24 DIAGNOSIS — Z794 Long term (current) use of insulin: Secondary | ICD-10-CM | POA: Diagnosis not present

## 2021-07-24 DIAGNOSIS — N1831 Chronic kidney disease, stage 3a: Secondary | ICD-10-CM | POA: Diagnosis not present

## 2021-07-24 DIAGNOSIS — E1122 Type 2 diabetes mellitus with diabetic chronic kidney disease: Secondary | ICD-10-CM | POA: Diagnosis not present

## 2021-07-24 DIAGNOSIS — J40 Bronchitis, not specified as acute or chronic: Secondary | ICD-10-CM | POA: Diagnosis not present

## 2021-08-07 DIAGNOSIS — E1122 Type 2 diabetes mellitus with diabetic chronic kidney disease: Secondary | ICD-10-CM | POA: Diagnosis not present

## 2021-08-08 DIAGNOSIS — M5459 Other low back pain: Secondary | ICD-10-CM | POA: Diagnosis not present

## 2021-08-08 DIAGNOSIS — Z794 Long term (current) use of insulin: Secondary | ICD-10-CM | POA: Diagnosis not present

## 2021-08-08 DIAGNOSIS — G4733 Obstructive sleep apnea (adult) (pediatric): Secondary | ICD-10-CM | POA: Diagnosis not present

## 2021-08-08 DIAGNOSIS — M5416 Radiculopathy, lumbar region: Secondary | ICD-10-CM | POA: Diagnosis not present

## 2021-08-08 DIAGNOSIS — E1122 Type 2 diabetes mellitus with diabetic chronic kidney disease: Secondary | ICD-10-CM | POA: Diagnosis not present

## 2021-08-08 DIAGNOSIS — I10 Essential (primary) hypertension: Secondary | ICD-10-CM | POA: Diagnosis not present

## 2021-08-08 DIAGNOSIS — N1831 Chronic kidney disease, stage 3a: Secondary | ICD-10-CM | POA: Diagnosis not present

## 2021-08-10 DIAGNOSIS — M5416 Radiculopathy, lumbar region: Secondary | ICD-10-CM | POA: Diagnosis not present

## 2021-08-10 DIAGNOSIS — M5459 Other low back pain: Secondary | ICD-10-CM | POA: Diagnosis not present

## 2021-08-13 DIAGNOSIS — M5459 Other low back pain: Secondary | ICD-10-CM | POA: Diagnosis not present

## 2021-08-13 DIAGNOSIS — M5416 Radiculopathy, lumbar region: Secondary | ICD-10-CM | POA: Diagnosis not present

## 2021-08-14 DIAGNOSIS — D2230 Melanocytic nevi of unspecified part of face: Secondary | ICD-10-CM | POA: Diagnosis not present

## 2021-08-14 DIAGNOSIS — N1831 Chronic kidney disease, stage 3a: Secondary | ICD-10-CM | POA: Diagnosis not present

## 2021-08-14 DIAGNOSIS — E1142 Type 2 diabetes mellitus with diabetic polyneuropathy: Secondary | ICD-10-CM | POA: Diagnosis not present

## 2021-08-14 DIAGNOSIS — D225 Melanocytic nevi of trunk: Secondary | ICD-10-CM | POA: Diagnosis not present

## 2021-08-14 DIAGNOSIS — D224 Melanocytic nevi of scalp and neck: Secondary | ICD-10-CM | POA: Diagnosis not present

## 2021-08-14 DIAGNOSIS — Z794 Long term (current) use of insulin: Secondary | ICD-10-CM | POA: Diagnosis not present

## 2021-08-14 DIAGNOSIS — E1122 Type 2 diabetes mellitus with diabetic chronic kidney disease: Secondary | ICD-10-CM | POA: Diagnosis not present

## 2021-08-14 DIAGNOSIS — D2260 Melanocytic nevi of unspecified upper limb, including shoulder: Secondary | ICD-10-CM | POA: Diagnosis not present

## 2021-08-14 DIAGNOSIS — L820 Inflamed seborrheic keratosis: Secondary | ICD-10-CM | POA: Diagnosis not present

## 2021-08-14 DIAGNOSIS — G4733 Obstructive sleep apnea (adult) (pediatric): Secondary | ICD-10-CM | POA: Diagnosis not present

## 2021-08-14 DIAGNOSIS — I10 Essential (primary) hypertension: Secondary | ICD-10-CM | POA: Diagnosis not present

## 2021-08-15 DIAGNOSIS — M5459 Other low back pain: Secondary | ICD-10-CM | POA: Diagnosis not present

## 2021-08-15 DIAGNOSIS — M5416 Radiculopathy, lumbar region: Secondary | ICD-10-CM | POA: Diagnosis not present

## 2021-08-20 DIAGNOSIS — M5459 Other low back pain: Secondary | ICD-10-CM | POA: Diagnosis not present

## 2021-08-20 DIAGNOSIS — M5416 Radiculopathy, lumbar region: Secondary | ICD-10-CM | POA: Diagnosis not present

## 2021-08-23 DIAGNOSIS — M5459 Other low back pain: Secondary | ICD-10-CM | POA: Diagnosis not present

## 2021-08-23 DIAGNOSIS — M5416 Radiculopathy, lumbar region: Secondary | ICD-10-CM | POA: Diagnosis not present

## 2021-08-28 DIAGNOSIS — M5459 Other low back pain: Secondary | ICD-10-CM | POA: Diagnosis not present

## 2021-08-28 DIAGNOSIS — M5416 Radiculopathy, lumbar region: Secondary | ICD-10-CM | POA: Diagnosis not present

## 2021-09-03 DIAGNOSIS — M5459 Other low back pain: Secondary | ICD-10-CM | POA: Diagnosis not present

## 2021-09-03 DIAGNOSIS — M5416 Radiculopathy, lumbar region: Secondary | ICD-10-CM | POA: Diagnosis not present

## 2021-09-05 DIAGNOSIS — B351 Tinea unguium: Secondary | ICD-10-CM | POA: Diagnosis not present

## 2021-09-05 DIAGNOSIS — E1140 Type 2 diabetes mellitus with diabetic neuropathy, unspecified: Secondary | ICD-10-CM | POA: Diagnosis not present

## 2021-09-05 DIAGNOSIS — Z794 Long term (current) use of insulin: Secondary | ICD-10-CM | POA: Diagnosis not present

## 2021-09-06 DIAGNOSIS — M5416 Radiculopathy, lumbar region: Secondary | ICD-10-CM | POA: Diagnosis not present

## 2021-09-06 DIAGNOSIS — M5459 Other low back pain: Secondary | ICD-10-CM | POA: Diagnosis not present

## 2021-09-11 DIAGNOSIS — M5459 Other low back pain: Secondary | ICD-10-CM | POA: Diagnosis not present

## 2021-09-11 DIAGNOSIS — M5416 Radiculopathy, lumbar region: Secondary | ICD-10-CM | POA: Diagnosis not present

## 2021-09-18 DIAGNOSIS — M5416 Radiculopathy, lumbar region: Secondary | ICD-10-CM | POA: Diagnosis not present

## 2021-09-18 DIAGNOSIS — M5459 Other low back pain: Secondary | ICD-10-CM | POA: Diagnosis not present

## 2021-09-25 DIAGNOSIS — M5416 Radiculopathy, lumbar region: Secondary | ICD-10-CM | POA: Diagnosis not present

## 2021-09-25 DIAGNOSIS — M5459 Other low back pain: Secondary | ICD-10-CM | POA: Diagnosis not present

## 2021-10-02 DIAGNOSIS — M5459 Other low back pain: Secondary | ICD-10-CM | POA: Diagnosis not present

## 2021-10-02 DIAGNOSIS — M5416 Radiculopathy, lumbar region: Secondary | ICD-10-CM | POA: Diagnosis not present

## 2021-10-04 DIAGNOSIS — E1122 Type 2 diabetes mellitus with diabetic chronic kidney disease: Secondary | ICD-10-CM | POA: Diagnosis not present

## 2021-10-04 DIAGNOSIS — K591 Functional diarrhea: Secondary | ICD-10-CM | POA: Diagnosis not present

## 2021-10-04 DIAGNOSIS — N1831 Chronic kidney disease, stage 3a: Secondary | ICD-10-CM | POA: Diagnosis not present

## 2021-10-04 DIAGNOSIS — E1142 Type 2 diabetes mellitus with diabetic polyneuropathy: Secondary | ICD-10-CM | POA: Diagnosis not present

## 2021-10-04 DIAGNOSIS — Z794 Long term (current) use of insulin: Secondary | ICD-10-CM | POA: Diagnosis not present

## 2021-10-08 DIAGNOSIS — K591 Functional diarrhea: Secondary | ICD-10-CM | POA: Diagnosis not present

## 2021-10-09 DIAGNOSIS — M5416 Radiculopathy, lumbar region: Secondary | ICD-10-CM | POA: Diagnosis not present

## 2021-10-09 DIAGNOSIS — M5459 Other low back pain: Secondary | ICD-10-CM | POA: Diagnosis not present

## 2021-10-26 DIAGNOSIS — M5459 Other low back pain: Secondary | ICD-10-CM | POA: Diagnosis not present

## 2021-10-26 DIAGNOSIS — M5416 Radiculopathy, lumbar region: Secondary | ICD-10-CM | POA: Diagnosis not present

## 2021-10-26 DIAGNOSIS — E1122 Type 2 diabetes mellitus with diabetic chronic kidney disease: Secondary | ICD-10-CM | POA: Diagnosis not present

## 2021-11-01 DIAGNOSIS — K219 Gastro-esophageal reflux disease without esophagitis: Secondary | ICD-10-CM | POA: Diagnosis not present

## 2021-11-01 DIAGNOSIS — N1831 Chronic kidney disease, stage 3a: Secondary | ICD-10-CM | POA: Diagnosis not present

## 2021-11-01 DIAGNOSIS — Z794 Long term (current) use of insulin: Secondary | ICD-10-CM | POA: Diagnosis not present

## 2021-11-01 DIAGNOSIS — I456 Pre-excitation syndrome: Secondary | ICD-10-CM | POA: Diagnosis not present

## 2021-11-01 DIAGNOSIS — E1122 Type 2 diabetes mellitus with diabetic chronic kidney disease: Secondary | ICD-10-CM | POA: Diagnosis not present

## 2021-11-01 DIAGNOSIS — K591 Functional diarrhea: Secondary | ICD-10-CM | POA: Diagnosis not present

## 2021-11-01 DIAGNOSIS — D126 Benign neoplasm of colon, unspecified: Secondary | ICD-10-CM | POA: Diagnosis not present

## 2021-11-01 DIAGNOSIS — G4733 Obstructive sleep apnea (adult) (pediatric): Secondary | ICD-10-CM | POA: Diagnosis not present

## 2021-12-12 ENCOUNTER — Other Ambulatory Visit: Payer: Self-pay | Admitting: *Deleted

## 2021-12-12 MED ORDER — TAMSULOSIN HCL 0.4 MG PO CAPS: 0.4000 mg | ORAL_CAPSULE | Freq: Every day | ORAL | 1 refills | Status: AC

## 2021-12-17 DIAGNOSIS — M5136 Other intervertebral disc degeneration, lumbar region: Secondary | ICD-10-CM | POA: Diagnosis not present

## 2021-12-17 DIAGNOSIS — M7918 Myalgia, other site: Secondary | ICD-10-CM | POA: Diagnosis not present

## 2021-12-17 DIAGNOSIS — M62830 Muscle spasm of back: Secondary | ICD-10-CM | POA: Diagnosis not present

## 2021-12-17 DIAGNOSIS — M5126 Other intervertebral disc displacement, lumbar region: Secondary | ICD-10-CM | POA: Diagnosis not present

## 2021-12-17 DIAGNOSIS — M5416 Radiculopathy, lumbar region: Secondary | ICD-10-CM | POA: Diagnosis not present

## 2021-12-27 DIAGNOSIS — E1142 Type 2 diabetes mellitus with diabetic polyneuropathy: Secondary | ICD-10-CM | POA: Diagnosis not present

## 2021-12-27 DIAGNOSIS — I10 Essential (primary) hypertension: Secondary | ICD-10-CM | POA: Diagnosis not present

## 2022-01-03 DIAGNOSIS — N1831 Chronic kidney disease, stage 3a: Secondary | ICD-10-CM | POA: Diagnosis not present

## 2022-01-03 DIAGNOSIS — G8929 Other chronic pain: Secondary | ICD-10-CM | POA: Diagnosis not present

## 2022-01-03 DIAGNOSIS — Z1389 Encounter for screening for other disorder: Secondary | ICD-10-CM | POA: Diagnosis not present

## 2022-01-03 DIAGNOSIS — M545 Low back pain, unspecified: Secondary | ICD-10-CM | POA: Diagnosis not present

## 2022-01-03 DIAGNOSIS — E1122 Type 2 diabetes mellitus with diabetic chronic kidney disease: Secondary | ICD-10-CM | POA: Diagnosis not present

## 2022-01-03 DIAGNOSIS — G4733 Obstructive sleep apnea (adult) (pediatric): Secondary | ICD-10-CM | POA: Diagnosis not present

## 2022-01-03 DIAGNOSIS — I129 Hypertensive chronic kidney disease with stage 1 through stage 4 chronic kidney disease, or unspecified chronic kidney disease: Secondary | ICD-10-CM | POA: Diagnosis not present

## 2022-01-03 DIAGNOSIS — E1142 Type 2 diabetes mellitus with diabetic polyneuropathy: Secondary | ICD-10-CM | POA: Diagnosis not present

## 2022-01-03 DIAGNOSIS — Z0000 Encounter for general adult medical examination without abnormal findings: Secondary | ICD-10-CM | POA: Diagnosis not present

## 2022-01-22 DIAGNOSIS — M545 Low back pain, unspecified: Secondary | ICD-10-CM | POA: Diagnosis not present

## 2022-01-22 DIAGNOSIS — M6281 Muscle weakness (generalized): Secondary | ICD-10-CM | POA: Diagnosis not present

## 2022-01-28 DIAGNOSIS — M6281 Muscle weakness (generalized): Secondary | ICD-10-CM | POA: Diagnosis not present

## 2022-01-28 DIAGNOSIS — M545 Low back pain, unspecified: Secondary | ICD-10-CM | POA: Diagnosis not present

## 2022-01-29 ENCOUNTER — Encounter: Payer: Self-pay | Admitting: Internal Medicine

## 2022-01-29 DIAGNOSIS — E1122 Type 2 diabetes mellitus with diabetic chronic kidney disease: Secondary | ICD-10-CM | POA: Diagnosis not present

## 2022-01-30 ENCOUNTER — Encounter
Admission: RE | Disposition: A | Discharge status: Home / Self Care | Payer: Self-pay | Source: Home / Self Care | Attending: Internal Medicine

## 2022-01-30 ENCOUNTER — Ambulatory Visit: Payer: Medicare HMO | Admitting: Anesthesiology

## 2022-01-30 ENCOUNTER — Encounter: Payer: Self-pay | Admitting: Internal Medicine

## 2022-01-30 ENCOUNTER — Ambulatory Visit
Admission: RE | Admit: 2022-01-30 | Discharge: 2022-01-30 | Disposition: A | Discharge status: Home / Self Care | Payer: Medicare HMO | Attending: Internal Medicine | Admitting: Internal Medicine

## 2022-01-30 DIAGNOSIS — K640 First degree hemorrhoids: Secondary | ICD-10-CM | POA: Diagnosis not present

## 2022-01-30 DIAGNOSIS — G4730 Sleep apnea, unspecified: Secondary | ICD-10-CM | POA: Insufficient documentation

## 2022-01-30 DIAGNOSIS — K591 Functional diarrhea: Secondary | ICD-10-CM | POA: Insufficient documentation

## 2022-01-30 DIAGNOSIS — K635 Polyp of colon: Secondary | ICD-10-CM | POA: Insufficient documentation

## 2022-01-30 DIAGNOSIS — K5730 Diverticulosis of large intestine without perforation or abscess without bleeding: Secondary | ICD-10-CM | POA: Diagnosis not present

## 2022-01-30 DIAGNOSIS — Z794 Long term (current) use of insulin: Secondary | ICD-10-CM | POA: Diagnosis not present

## 2022-01-30 DIAGNOSIS — K648 Other hemorrhoids: Secondary | ICD-10-CM | POA: Diagnosis not present

## 2022-01-30 DIAGNOSIS — Z1211 Encounter for screening for malignant neoplasm of colon: Secondary | ICD-10-CM | POA: Diagnosis not present

## 2022-01-30 DIAGNOSIS — R197 Diarrhea, unspecified: Secondary | ICD-10-CM | POA: Diagnosis not present

## 2022-01-30 DIAGNOSIS — N189 Chronic kidney disease, unspecified: Secondary | ICD-10-CM | POA: Insufficient documentation

## 2022-01-30 DIAGNOSIS — K219 Gastro-esophageal reflux disease without esophagitis: Secondary | ICD-10-CM | POA: Insufficient documentation

## 2022-01-30 DIAGNOSIS — E1122 Type 2 diabetes mellitus with diabetic chronic kidney disease: Secondary | ICD-10-CM | POA: Diagnosis not present

## 2022-01-30 DIAGNOSIS — F32A Depression, unspecified: Secondary | ICD-10-CM | POA: Diagnosis not present

## 2022-01-30 DIAGNOSIS — I129 Hypertensive chronic kidney disease with stage 1 through stage 4 chronic kidney disease, or unspecified chronic kidney disease: Secondary | ICD-10-CM | POA: Insufficient documentation

## 2022-01-30 DIAGNOSIS — F419 Anxiety disorder, unspecified: Secondary | ICD-10-CM | POA: Diagnosis not present

## 2022-01-30 DIAGNOSIS — Z86010 Personal history of colonic polyps: Secondary | ICD-10-CM | POA: Diagnosis not present

## 2022-01-30 HISTORY — DX: Male erectile dysfunction, unspecified: N52.9

## 2022-01-30 HISTORY — DX: Testicular hypofunction: E29.1

## 2022-01-30 HISTORY — DX: Polyp of colon: K63.5

## 2022-01-30 HISTORY — DX: Anxiety disorder, unspecified: F41.9

## 2022-01-30 HISTORY — DX: Insomnia, unspecified: G47.00

## 2022-01-30 HISTORY — DX: Gastro-esophageal reflux disease without esophagitis: K21.9

## 2022-01-30 HISTORY — DX: Migraine, unspecified, not intractable, without status migrainosus: G43.909

## 2022-01-30 HISTORY — DX: Cortical age-related cataract, unspecified eye: H25.019

## 2022-01-30 HISTORY — DX: Chronic kidney disease, unspecified: N18.9

## 2022-01-30 HISTORY — PX: COLONOSCOPY: SHX5424

## 2022-01-30 LAB — GLUCOSE, CAPILLARY: Glucose-Capillary: 135 mg/dL — ABNORMAL HIGH (ref 70–99)

## 2022-01-30 SURGERY — COLONOSCOPY
Anesthesia: General

## 2022-01-30 MED ORDER — LIDOCAINE 2% (20 MG/ML) 5 ML SYRINGE
INTRAMUSCULAR | Status: DC | PRN
  Administered 2022-01-30: 20 mg via INTRAVENOUS

## 2022-01-30 MED ORDER — PROPOFOL 500 MG/50ML IV EMUL
INTRAVENOUS | Status: DC | PRN
  Administered 2022-01-30: 120 ug/kg/min via INTRAVENOUS

## 2022-01-30 MED ORDER — PROPOFOL 10 MG/ML IV BOLUS
INTRAVENOUS | Status: DC | PRN
  Administered 2022-01-30: 70 mg via INTRAVENOUS

## 2022-01-30 MED ORDER — SODIUM CHLORIDE 0.9 % IV SOLN
INTRAVENOUS | Status: DC
  Administered 2022-01-30: 1000 mL via INTRAVENOUS

## 2022-01-30 NOTE — Transfer of Care (Signed)
Immediate Anesthesia Transfer of Care Note  Patient: Gregory Campbell  Procedure(s) Performed: COLONOSCOPY  Patient Location: Endoscopy Unit  Anesthesia Type:General  Level of Consciousness: awake  Airway & Oxygen Therapy: Patient Spontanous Breathing and Patient connected to nasal cannula oxygen  Post-op Assessment: Report given to RN and Post -op Vital signs reviewed and stable  Post vital signs: Reviewed  Last Vitals:  Vitals Value Taken Time  BP 109/58 01/30/22 0855  Temp 36.2 C 01/30/22 0855  Pulse 62 01/30/22 0855  Resp 18 01/30/22 0855  SpO2 93 % 01/30/22 0855    Last Pain:  Vitals:   01/30/22 0855  TempSrc: Temporal  PainSc: Asleep         Complications: No notable events documented.

## 2022-01-30 NOTE — Op Note (Signed)
[facility] Gastroenterology Patient Name: Gregory Campbell Procedure Date: 01/30/2022 8:31 AM MRN: [id number] Account #: [id number] Date of Birth: 05-20-1941 Admit Type: Outpatient Age: 81 Room: [facility] ENDO ROOM 2 Gender: Male Note Status: Finalized Instrument Name: Park Meo [id number] Procedure:             Colonoscopy Indications:           Functional diarrhea Providers:             Benay Pike. Alice Reichert MD, MD Referring MD:          [name] MD, MD (Referring MD) Medicines:             Propofol per Anesthesia Complications:         No immediate complications. Procedure:             Pre-Anesthesia Assessment:                        - The risks and benefits of the procedure and the                         sedation options and risks were discussed with the                         patient. All questions were answered and informed                         consent was obtained.                        - Patient identification and proposed procedure were                         verified prior to the procedure by the nurse. The                         procedure was verified in the pre-procedure area in                         the procedure room.                        - ASA Grade Assessment: III - A patient with severe                         systemic disease.                        - After reviewing the risks and benefits, the patient                         was deemed in satisfactory condition to undergo the                         procedure.                        After obtaining informed consent, the colonoscope was                         passed under direct vision. Throughout  the procedure,                         the patient's blood pressure, pulse, and oxygen                         saturations were monitored continuously. The                         Colonoscope was introduced through the anus and                         advanced to the the cecum,  identified by appendiceal                         orifice and ileocecal valve. The colonoscopy was                         performed without difficulty. The patient tolerated                         the procedure well. The quality of the bowel                         preparation was excellent. The ileocecal valve,                         appendiceal orifice, and rectum were photographed. Findings:      The perianal and digital rectal examinations were normal. Pertinent       negatives include normal sphincter tone and no palpable rectal lesions.      Non-bleeding internal hemorrhoids were found during retroflexion. The       hemorrhoids were Grade I (internal hemorrhoids that do not prolapse).      Many small-mouthed diverticula were found in the sigmoid colon. There       was no evidence of diverticular bleeding.      Two semi-pedunculated polyps were found in the sigmoid colon and       transverse colon. The polyps were 5 to 8 mm in size. These polyps were       removed with a hot snare. Resection and retrieval were complete.      The exam was otherwise without abnormality.      Biopsies for histology were taken with a cold forceps from the random       colon for evaluation of microscopic colitis. Impression:            - Non-bleeding internal hemorrhoids.                        - Mild diverticulosis in the sigmoid colon. There was                         no evidence of diverticular bleeding.                        - Two 5 to 8 mm polyps in the sigmoid colon and in the                         transverse colon, removed with a  hot snare. Resected                         and retrieved.                        - The examination was otherwise normal.                        - Biopsies were taken with a cold forceps from the                         random colon for evaluation of microscopic colitis. Recommendation:        - Patient has a contact number available for                          emergencies. The signs and symptoms of potential                         delayed complications were discussed with the patient.                         Return to normal activities tomorrow. Written                         discharge instructions were provided to the patient.                        - Resume previous diet.                        - Continue present medications.                        - Await pathology results.                        - If polyps are benign or adenomatous without                         dysplasia, I will advise NO further colonoscopy due to                         advanced age and/or severe comorbidity.                        - Return to my office in 6 weeks.                        - Telephone GI office to schedule appointment.                        - The findings and recommendations were discussed with                         the patient. Procedure Code(s):     --- Professional ---                        [phone], Colonoscopy, flexible; with removal of  tumor(s), polyp(s), or other lesion(s) by snare                         technique                        45380, 59, Colonoscopy, flexible; with biopsy, single                         or multiple Diagnosis Code(s):     --- Professional ---                        K57.30, Diverticulosis of large intestine without                         perforation or abscess without bleeding                        K59.1, Functional diarrhea                        K63.5, Polyp of colon                        K64.0, First degree hemorrhoids CPT copyright 2019 American [employer]. All rights reserved. The codes documented in this report are preliminary and upon coder review may  be revised to meet current compliance requirements. Efrain Sella MD, MD 01/30/2022 8:56:24 AM This report has been signed electronically. Number of Addenda: 0 Note Initiated On: 01/30/2022 8:31 AM Scope Withdrawal Time:  0 hours 7 minutes 29 seconds  Total Procedure Duration: 0 hours 12 minutes 31 seconds  Estimated Blood Loss:  Estimated blood loss: none.      [facility]

## 2022-01-30 NOTE — Interval H&P Note (Signed)
History and Physical Interval Note:  01/30/2022 8:23 AM  Gregory Campbell  has presented today for surgery, with the diagnosis of Functional diarrhea (K59.1).  The various methods of treatment have been discussed with the patient and family. After consideration of risks, benefits and other options for treatment, the patient has consented to  Procedure(s) with comments: COLONOSCOPY (N/A) - IDDM as a surgical intervention.  The patient's history has been reviewed, patient examined, no change in status, stable for surgery.  I have reviewed the patient's chart and labs.  Questions were answered to the patient's satisfaction.     [place], [place]

## 2022-01-30 NOTE — Anesthesia Preprocedure Evaluation (Signed)
Anesthesia Evaluation  Patient identified by MRN, date of birth, ID band Patient awake    Reviewed: Allergy & Precautions, H&P , NPO status , Patient's Chart, lab work & pertinent test results, reviewed documented beta blocker date and time   History of Anesthesia Complications Negative for: history of anesthetic complications  Airway Mallampati: I  TM Distance: >3 FB Neck ROM: full    Dental  (+) Upper Dentures, Edentulous Upper, Missing, Caps, Dental Advidsory Given   Pulmonary neg shortness of breath, sleep apnea , neg COPD, neg recent URI,    Pulmonary exam normal breath sounds clear to auscultation       Cardiovascular Exercise Tolerance: Good hypertension, (-) angina(-) Past MI and (-) Cardiac Stents Normal cardiovascular exam(-) dysrhythmias (-) Valvular Problems/Murmurs Rhythm:regular Rate:Normal     Neuro/Psych PSYCHIATRIC DISORDERS Anxiety Depression negative neurological ROS     GI/Hepatic Neg liver ROS, GERD  ,  Endo/Other  diabetes  Renal/GU CRFRenal disease  negative genitourinary   Musculoskeletal   Abdominal   Peds  Hematology negative hematology ROS (+)   Anesthesia Other Findings Past Medical History: No date: Anxiety No date: Cataract cortical, senile No date: Chronic kidney disease No date: Colon polyps No date: Diabetes mellitus without complication (HCC) No date: Erectile dysfunction No date: GERD (gastroesophageal reflux disease) No date: Hyperlipidemia No date: Hypertension No date: Hypogonadism male No date: Insomnia No date: Migraines   Reproductive/Obstetrics negative OB ROS                             Anesthesia Physical Anesthesia Plan  ASA: 3  Anesthesia Plan: General   Post-op Pain Management:    Induction: Intravenous  PONV Risk Score and Plan: 2 and Propofol infusion and TIVA  Airway Management Planned: Natural Airway and Nasal  Cannula  Additional Equipment:   Intra-op Plan:   Post-operative Plan:   Informed Consent: I have reviewed the patients History and Physical, chart, labs and discussed the procedure including the risks, benefits and alternatives for the proposed anesthesia with the patient or authorized representative who has indicated his/her understanding and acceptance.     Dental Advisory Given  Plan Discussed with: Anesthesiologist, CRNA and Surgeon  Anesthesia Plan Comments:         Anesthesia Quick Evaluation

## 2022-01-30 NOTE — H&P (Signed)
Outpatient short stay form Pre-procedure 01/30/2022 8:20 AM Gregory Campbell K. Alice Reichert, M.D.  Primary Physician: Littie Deeds, M.D.  Reason for visit:  Functional diarrhea  History of present illness:  Mr. Gregory Campbell presents today for clinical follow-up. Labs obtained and his last initial visit (C difficile Toxins A+B, EIA - [facility] - Calprotectin, Fecal - [facility] - TSH+Free T4 - [facility] - C-Reactive Protein, Quant - [facility] - Celiac Disease Ab Screen w/Rfx - [facility] - CBC w/auto Differential (5 Part) Above were all negative or normal).  Patient continues to have 2-3 loose stools per day and always seems to have a loose stool when he urinates. He denies any nocturnal bowel movements. He will have 1 to 2 days of "normal sausage shaped" stools per week. No rectal bleeding. All previous history as noted below. Patient continues to treat diabetes type 2 with insulin, last hemoglobin A1c is reported as "6.9". Patient reports ideally he would like to be had a hemoglobin A1c of 5.9, although this would require him to lose more weight.     Current Facility-Administered Medications:    0.9 %  sodium chloride infusion, , Intravenous, Continuous, [place], Benay Pike, MD, Last Rate: 20 mL/hr at 01/30/22 0757, 1,000 mL at 01/30/22 0757  Medications Prior to Admission  Medication Sig Dispense Refill Last Dose   busPIRone (BUSPAR) 15 MG tablet    01/29/2022   colchicine 0.6 MG tablet Take 0.6 mg by mouth daily.    at prn   escitalopram (LEXAPRO) 20 MG tablet Take 20 mg by mouth daily.   Past Week   insulin aspart (NOVOLOG) 100 UNIT/ML FlexPen Use for sliding scale 3 times per day.  Max daily dose 50 units.   Past Week   LANTUS SOLOSTAR 100 UNIT/ML Solostar Pen Inject into the skin.   Past Week   losartan (COZAAR) 25 MG tablet Take 25 mg by mouth daily.   Past Week   pantoprazole (PROTONIX) 40 MG tablet Take 40 mg by mouth daily.   Past Week   pregabalin (LYRICA) 75 MG capsule Take 75 mg by mouth 2 (two)  times daily.   Past Week   propranolol (INDERAL) 40 MG tablet Take 40 mg by mouth 2 (two) times daily.   Past Week   tamsulosin (FLOMAX) 0.4 MG CAPS capsule Take 1 capsule (0.4 mg total) by mouth daily. 90 capsule 1 Past Week   tiZANidine (ZANAFLEX) 2 MG tablet TAKE 1 TABLET BY MOUTH NIGHTLY AS NEEDED.   Past Week   traZODone (DESYREL) 100 MG tablet Take 200 mg by mouth at bedtime.   Past Week     Allergies  Allergen Reactions   Codeine    Latex      Past Medical History:  Diagnosis Date   Anxiety    Cataract cortical, senile    Chronic kidney disease    Colon polyps    Diabetes mellitus without complication (HCC)    Erectile dysfunction    GERD (gastroesophageal reflux disease)    Hyperlipidemia    Hypertension    Hypogonadism male    Insomnia    Migraines     Review of systems:  Otherwise negative.    Physical Exam  Gen: Alert, oriented. Appears stated age.  HEENT: [place]/AT. PERRLA. Lungs: CTA, no wheezes. CV: RR nl S1, S2. Abd: soft, benign, no masses. BS+ Ext: No edema. Pulses 2+    Planned procedures: Proceed with colonoscopy. The patient understands the nature of the planned procedure, indications, risks, alternatives and potential  complications including but not limited to bleeding, infection, perforation, damage to internal organs and possible oversedation/side effects from anesthesia. The patient agrees and gives consent to proceed.  Please refer to procedure notes for findings, recommendations and patient disposition/instructions.     Nollan Muldrow K. Alice Reichert, M.D. Gastroenterology 01/30/2022  8:20 AM

## 2022-01-31 ENCOUNTER — Encounter: Payer: Self-pay | Admitting: Internal Medicine

## 2022-01-31 LAB — SURGICAL PATHOLOGY

## 2022-02-05 NOTE — Anesthesia Postprocedure Evaluation (Signed)
Anesthesia Post Note  Patient: Kassim Erazo  Procedure(s) Performed: COLONOSCOPY  Patient location during evaluation: Endoscopy Anesthesia Type: General Level of consciousness: awake and alert Pain management: pain level controlled Vital Signs Assessment: post-procedure vital signs reviewed and stable Respiratory status: spontaneous breathing, nonlabored ventilation, respiratory function stable and patient connected to nasal cannula oxygen Cardiovascular status: blood pressure returned to baseline and stable Postop Assessment: no apparent nausea or vomiting Anesthetic complications: no   No notable events documented.   Last Vitals:  Vitals:   01/30/22 0905 01/30/22 0915  BP: 130/80 (!) 168/77  Pulse: 61 (!) 55  Resp: 16 14  Temp:    SpO2: 98% 100%    Last Pain:  Vitals:   01/30/22 0915  TempSrc:   PainSc: 0-No pain                 Martha Clan

## 2022-02-06 DIAGNOSIS — M545 Low back pain, unspecified: Secondary | ICD-10-CM | POA: Diagnosis not present

## 2022-02-06 DIAGNOSIS — M6281 Muscle weakness (generalized): Secondary | ICD-10-CM | POA: Diagnosis not present

## 2022-02-08 DIAGNOSIS — M545 Low back pain, unspecified: Secondary | ICD-10-CM | POA: Diagnosis not present

## 2022-02-08 DIAGNOSIS — M6281 Muscle weakness (generalized): Secondary | ICD-10-CM | POA: Diagnosis not present

## 2022-02-11 DIAGNOSIS — M6281 Muscle weakness (generalized): Secondary | ICD-10-CM | POA: Diagnosis not present

## 2022-02-11 DIAGNOSIS — M545 Low back pain, unspecified: Secondary | ICD-10-CM | POA: Diagnosis not present

## 2022-02-18 DIAGNOSIS — M545 Low back pain, unspecified: Secondary | ICD-10-CM | POA: Diagnosis not present

## 2022-02-18 DIAGNOSIS — M6281 Muscle weakness (generalized): Secondary | ICD-10-CM | POA: Diagnosis not present

## 2022-02-22 DIAGNOSIS — M545 Low back pain, unspecified: Secondary | ICD-10-CM | POA: Diagnosis not present

## 2022-02-22 DIAGNOSIS — M6281 Muscle weakness (generalized): Secondary | ICD-10-CM | POA: Diagnosis not present

## 2022-02-25 DIAGNOSIS — M6281 Muscle weakness (generalized): Secondary | ICD-10-CM | POA: Diagnosis not present

## 2022-02-25 DIAGNOSIS — M545 Low back pain, unspecified: Secondary | ICD-10-CM | POA: Diagnosis not present

## 2022-02-28 DIAGNOSIS — M545 Low back pain, unspecified: Secondary | ICD-10-CM | POA: Diagnosis not present

## 2022-02-28 DIAGNOSIS — M6281 Muscle weakness (generalized): Secondary | ICD-10-CM | POA: Diagnosis not present

## 2022-03-05 DIAGNOSIS — M545 Low back pain, unspecified: Secondary | ICD-10-CM | POA: Diagnosis not present

## 2022-03-05 DIAGNOSIS — M6281 Muscle weakness (generalized): Secondary | ICD-10-CM | POA: Diagnosis not present

## 2022-03-29 DIAGNOSIS — M6281 Muscle weakness (generalized): Secondary | ICD-10-CM | POA: Diagnosis not present

## 2022-03-29 DIAGNOSIS — M545 Low back pain, unspecified: Secondary | ICD-10-CM | POA: Diagnosis not present

## 2022-04-05 DIAGNOSIS — M6281 Muscle weakness (generalized): Secondary | ICD-10-CM | POA: Diagnosis not present

## 2022-04-05 DIAGNOSIS — M545 Low back pain, unspecified: Secondary | ICD-10-CM | POA: Diagnosis not present

## 2022-04-09 DIAGNOSIS — M62830 Muscle spasm of back: Secondary | ICD-10-CM | POA: Diagnosis not present

## 2022-04-09 DIAGNOSIS — M5126 Other intervertebral disc displacement, lumbar region: Secondary | ICD-10-CM | POA: Diagnosis not present

## 2022-04-09 DIAGNOSIS — M5416 Radiculopathy, lumbar region: Secondary | ICD-10-CM | POA: Diagnosis not present

## 2022-04-09 DIAGNOSIS — M5136 Other intervertebral disc degeneration, lumbar region: Secondary | ICD-10-CM | POA: Diagnosis not present

## 2022-04-30 DIAGNOSIS — M5126 Other intervertebral disc displacement, lumbar region: Secondary | ICD-10-CM | POA: Diagnosis not present

## 2022-04-30 DIAGNOSIS — M5416 Radiculopathy, lumbar region: Secondary | ICD-10-CM | POA: Diagnosis not present

## 2022-05-03 ENCOUNTER — Ambulatory Visit: Payer: Medicare HMO

## 2022-05-07 DIAGNOSIS — E1142 Type 2 diabetes mellitus with diabetic polyneuropathy: Secondary | ICD-10-CM | POA: Diagnosis not present

## 2022-05-07 DIAGNOSIS — I10 Essential (primary) hypertension: Secondary | ICD-10-CM | POA: Diagnosis not present

## 2022-05-07 DIAGNOSIS — Z794 Long term (current) use of insulin: Secondary | ICD-10-CM | POA: Diagnosis not present

## 2022-05-07 DIAGNOSIS — E1122 Type 2 diabetes mellitus with diabetic chronic kidney disease: Secondary | ICD-10-CM | POA: Diagnosis not present

## 2022-05-07 DIAGNOSIS — N1831 Chronic kidney disease, stage 3a: Secondary | ICD-10-CM | POA: Diagnosis not present

## 2022-05-10 ENCOUNTER — Ambulatory Visit
Admission: RE | Admit: 2022-05-10 | Discharge: 2022-05-10 | Disposition: A | Discharge status: Home / Self Care | Payer: Medicare HMO | Source: Ambulatory Visit | Attending: Internal Medicine | Admitting: Internal Medicine

## 2022-05-10 ENCOUNTER — Other Ambulatory Visit: Payer: Self-pay | Admitting: Internal Medicine

## 2022-05-10 DIAGNOSIS — R935 Abnormal findings on diagnostic imaging of other abdominal regions, including retroperitoneum: Secondary | ICD-10-CM | POA: Diagnosis not present

## 2022-05-10 DIAGNOSIS — Z9889 Other specified postprocedural states: Secondary | ICD-10-CM | POA: Diagnosis not present

## 2022-05-10 DIAGNOSIS — K7689 Other specified diseases of liver: Secondary | ICD-10-CM | POA: Insufficient documentation

## 2022-05-10 DIAGNOSIS — R109 Unspecified abdominal pain: Secondary | ICD-10-CM | POA: Diagnosis not present

## 2022-05-10 MED ORDER — GADOBUTROL 1 MMOL/ML IV SOLN
10.0000 mL | Freq: Once | INTRAVENOUS | Status: AC | PRN
Start: 2022-05-10 — End: 2022-05-10
  Administered 2022-05-10: 10 mL via INTRAVENOUS

## 2022-05-14 DIAGNOSIS — I129 Hypertensive chronic kidney disease with stage 1 through stage 4 chronic kidney disease, or unspecified chronic kidney disease: Secondary | ICD-10-CM | POA: Diagnosis not present

## 2022-05-14 DIAGNOSIS — Z794 Long term (current) use of insulin: Secondary | ICD-10-CM | POA: Diagnosis not present

## 2022-05-14 DIAGNOSIS — E1142 Type 2 diabetes mellitus with diabetic polyneuropathy: Secondary | ICD-10-CM | POA: Diagnosis not present

## 2022-05-14 DIAGNOSIS — R350 Frequency of micturition: Secondary | ICD-10-CM | POA: Diagnosis not present

## 2022-05-14 DIAGNOSIS — G4733 Obstructive sleep apnea (adult) (pediatric): Secondary | ICD-10-CM | POA: Diagnosis not present

## 2022-05-14 DIAGNOSIS — E1169 Type 2 diabetes mellitus with other specified complication: Secondary | ICD-10-CM | POA: Diagnosis not present

## 2022-05-14 DIAGNOSIS — F3340 Major depressive disorder, recurrent, in remission, unspecified: Secondary | ICD-10-CM | POA: Diagnosis not present

## 2022-05-14 DIAGNOSIS — N1831 Chronic kidney disease, stage 3a: Secondary | ICD-10-CM | POA: Diagnosis not present

## 2022-05-14 DIAGNOSIS — E1122 Type 2 diabetes mellitus with diabetic chronic kidney disease: Secondary | ICD-10-CM | POA: Diagnosis not present

## 2022-05-24 DIAGNOSIS — M5416 Radiculopathy, lumbar region: Secondary | ICD-10-CM | POA: Diagnosis not present

## 2022-05-24 DIAGNOSIS — M5136 Other intervertebral disc degeneration, lumbar region: Secondary | ICD-10-CM | POA: Diagnosis not present

## 2022-05-24 DIAGNOSIS — M5126 Other intervertebral disc displacement, lumbar region: Secondary | ICD-10-CM | POA: Diagnosis not present

## 2022-06-25 ENCOUNTER — Ambulatory Visit: Payer: Medicare HMO | Admitting: Urology

## 2022-07-22 DIAGNOSIS — E1122 Type 2 diabetes mellitus with diabetic chronic kidney disease: Secondary | ICD-10-CM | POA: Diagnosis not present

## 2022-12-17 ENCOUNTER — Other Ambulatory Visit: Payer: Self-pay | Admitting: Urology

## 2022-12-17 NOTE — Telephone Encounter (Signed)
Left detailed message on patient's voicemail letting him know that patient is overdue for follow up and we need to get that scheduled before we can refill. Needs scheduled with Carollee Herter.

## 2023-04-29 ENCOUNTER — Other Ambulatory Visit: Payer: Self-pay | Admitting: Urology
